# Patient Record
Sex: Male | Born: 1960 | Race: Black or African American | Hispanic: Refuse to answer | Marital: Single
Health system: Midwestern US, Community
[De-identification: ages and names within clinical notes are randomized; demographics above are authoritative.]

## PROBLEM LIST (undated history)

## (undated) DIAGNOSIS — I428 Other cardiomyopathies: Secondary | ICD-10-CM

## (undated) DIAGNOSIS — H6122 Impacted cerumen, left ear: Secondary | ICD-10-CM

## (undated) DIAGNOSIS — I4892 Unspecified atrial flutter: Secondary | ICD-10-CM

## (undated) DIAGNOSIS — I4891 Unspecified atrial fibrillation: Secondary | ICD-10-CM

## (undated) DIAGNOSIS — I5022 Chronic systolic (congestive) heart failure: Secondary | ICD-10-CM

---

## 2014-03-20 IMAGING — US DOP VENOUS LWR EXT RT
1 series · 15 of 17 positions shown · non-contrast
Comparison: None.

HISTORY: 52 year old Male, Pain in right leg; Acute embolism and thrombosis of deep veins of right lower extremity
TECHNIQUE: Right lower extremity venous duplex ultrasound exam performed.

[Series 1: dop venous lwr ext right · 15 of 17 slices shown]
[im 1/17]
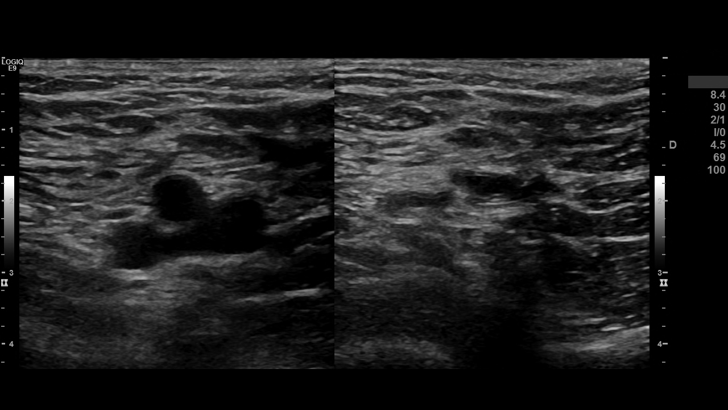
[im 2/17]
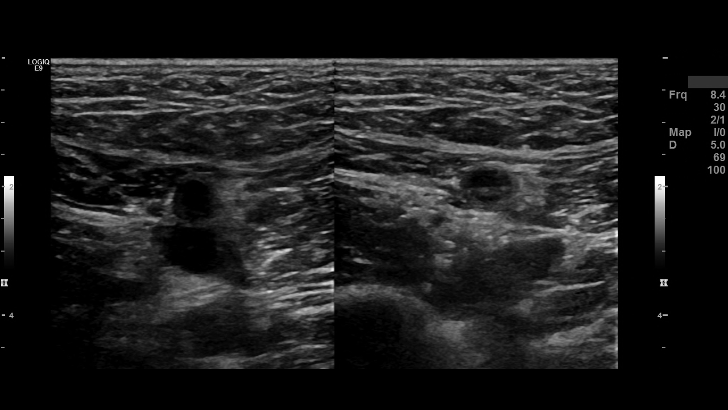
[im 3/17]
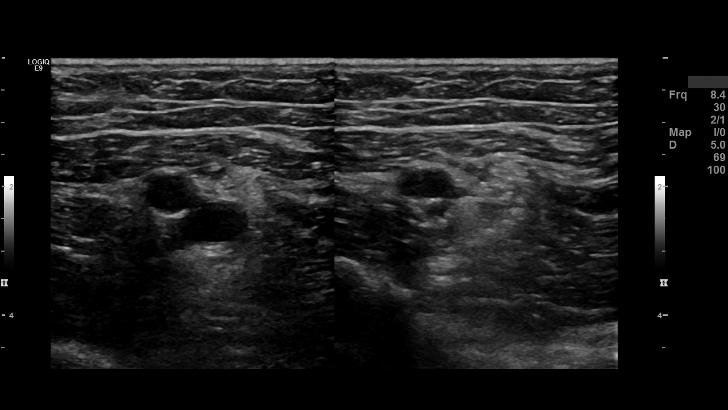
[im 4/17]
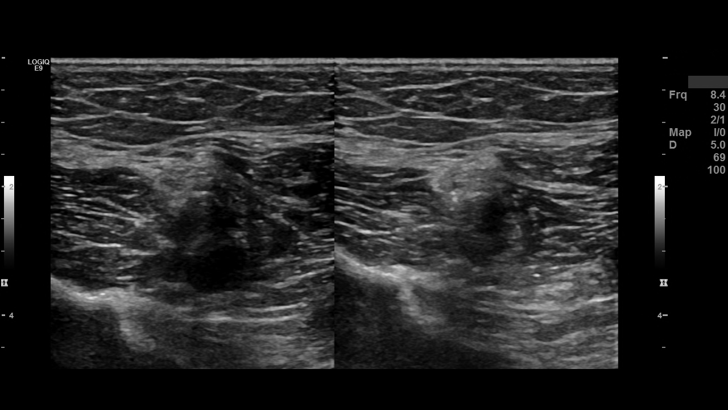
[im 6/17]
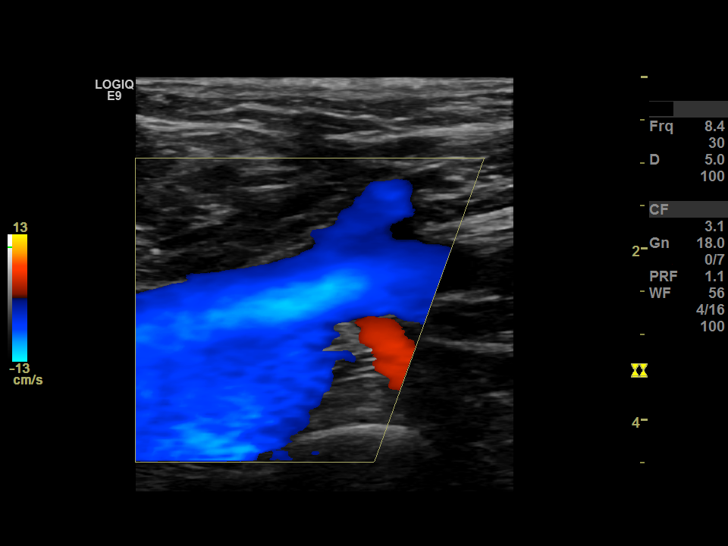
[im 7/17]
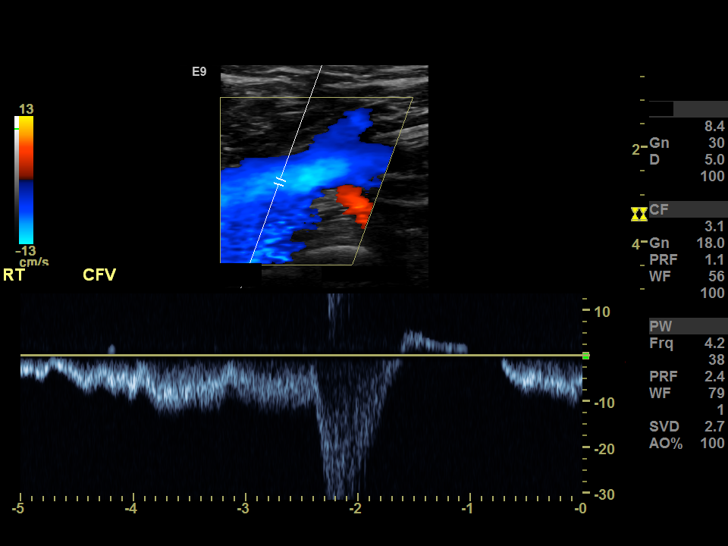
[im 8/17]
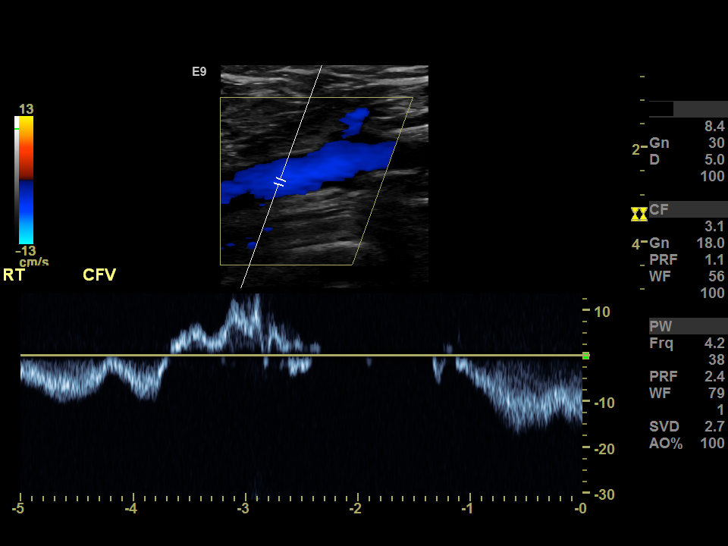
[im 9/17]
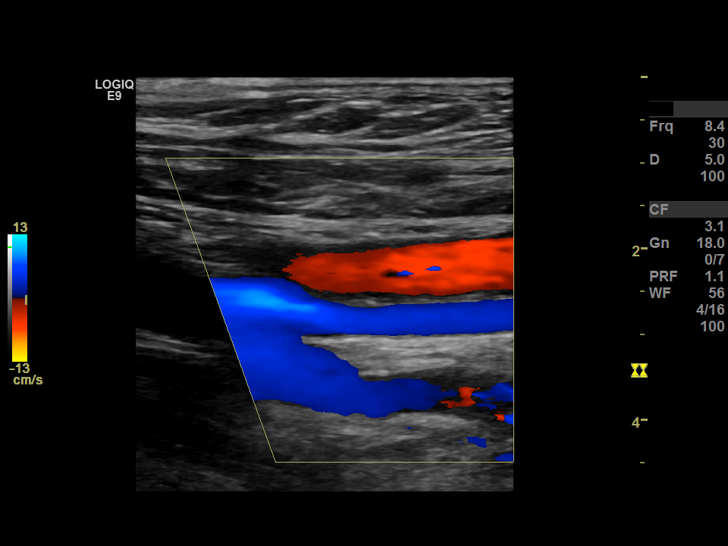
[im 10/17]
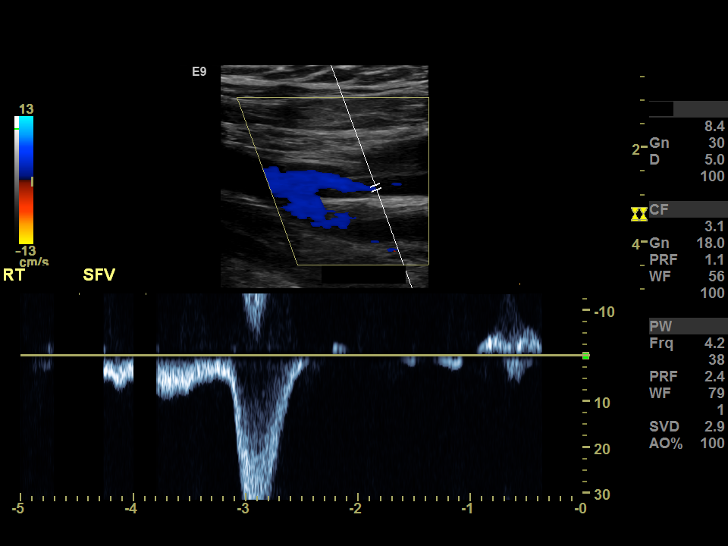
[im 11/17]
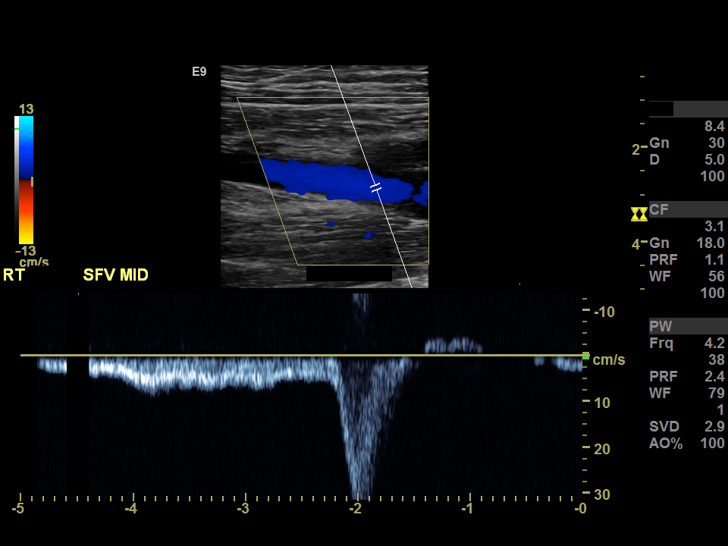
[im 12/17]
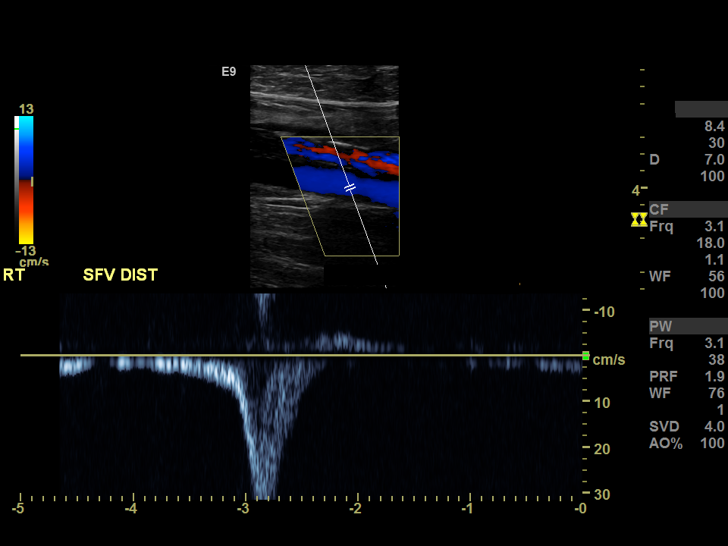
[im 14/17]
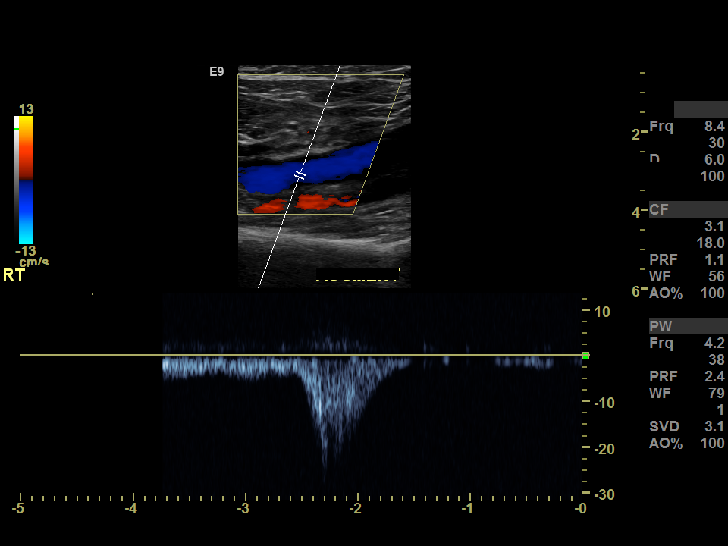
[im 15/17]
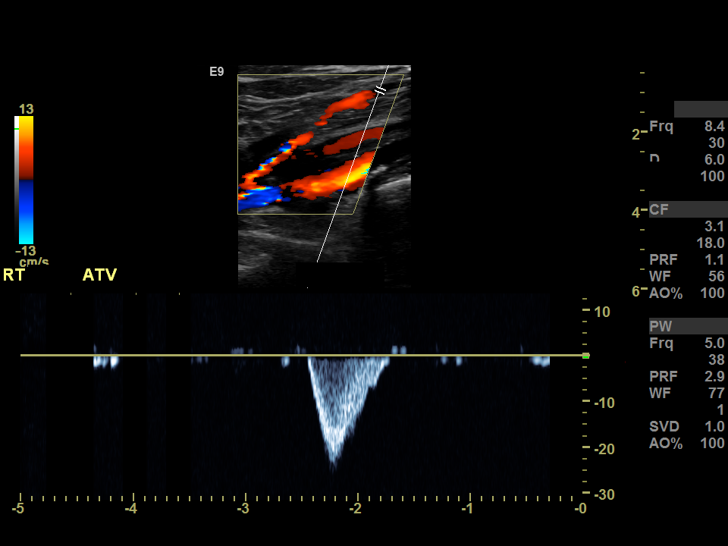
[im 16/17]
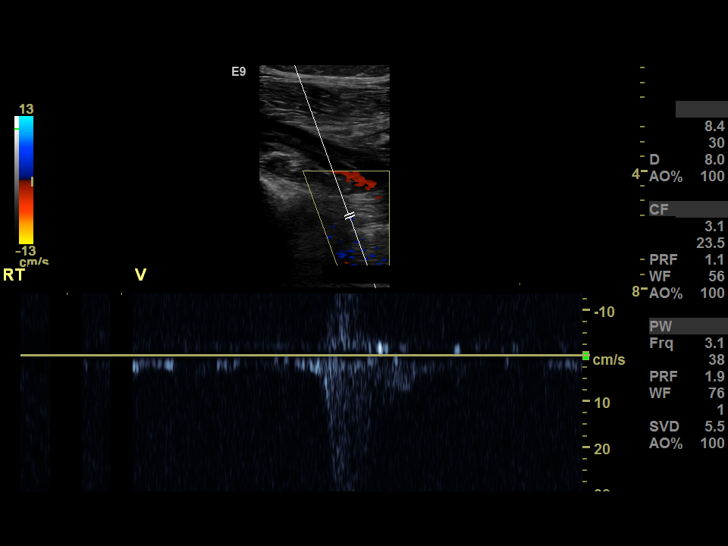
[im 17/17]
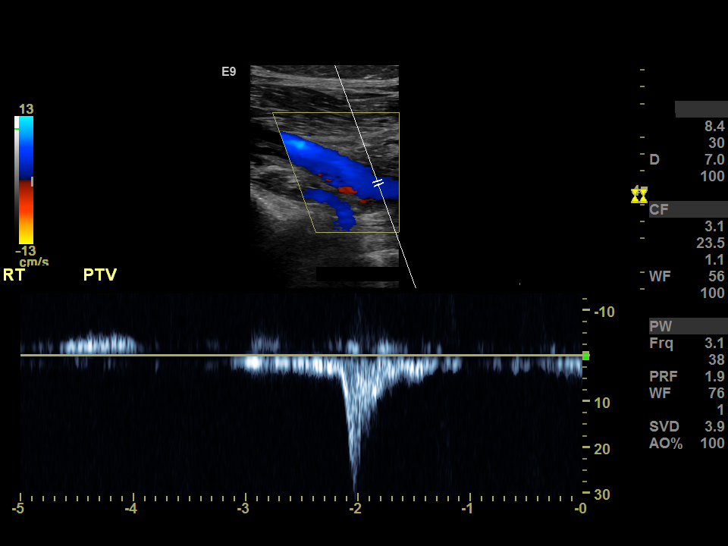

[15 of 17 positions shown; findings below may reference images not displayed]

FINDINGS: Deep veins of right lower extremity demonstrate normal blood flow, compressibility and augmentation without deep vein thrombosis.
IMPRESSION: There is no deep vein thrombosis of right lower extremity.

## 2014-03-25 IMAGING — US US RENAL W/DOPPLER RENAL
1 series · 14 of 25 positions shown · non-contrast
Comparison: None available.

HISTORY: Chronic kidney disease.
TECHNIQUE: High-resolution ultrasound with color-flow Doppler.

[Series 1: us renal w/doppler renal · 14 of 38 slices shown]
[im 1/38]
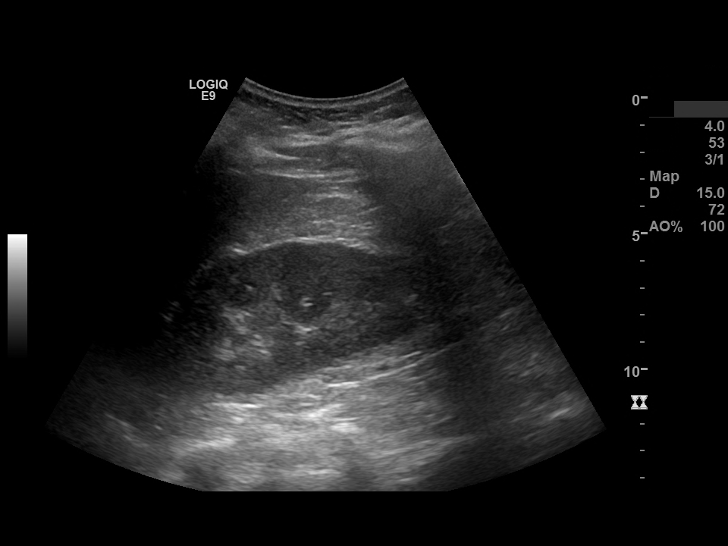
[im 4/38]
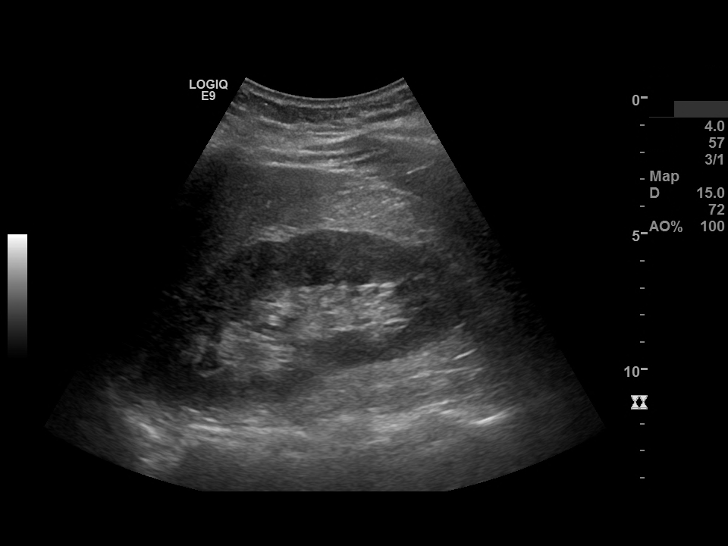
[im 7/38]
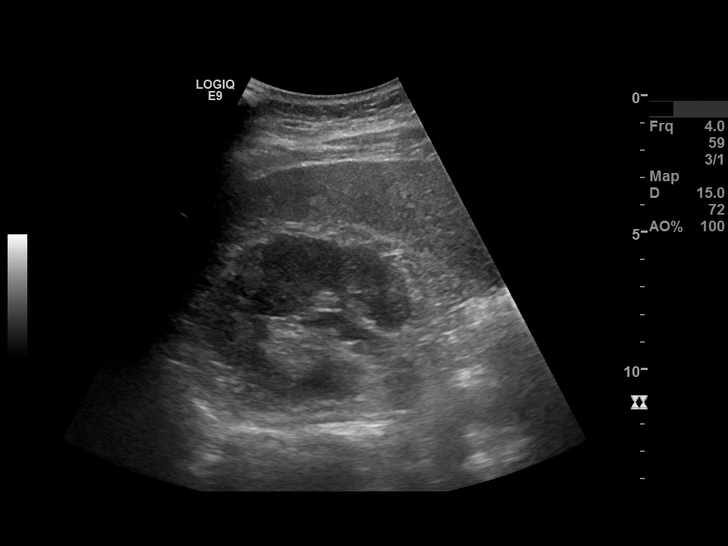
[im 10/38]
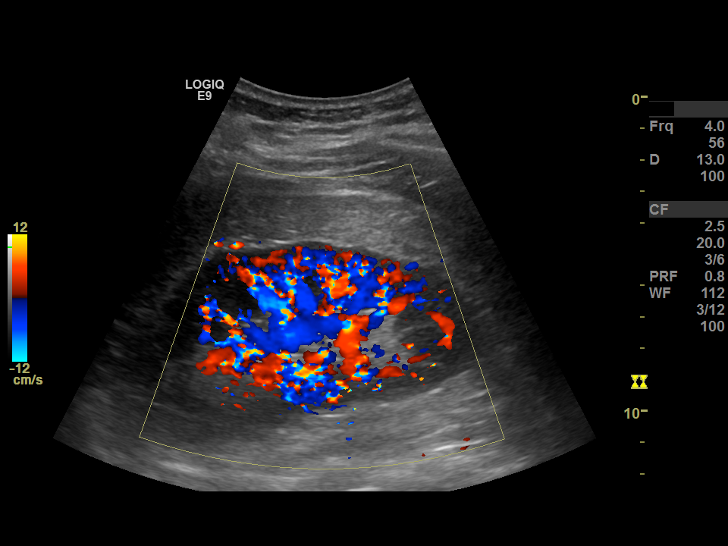
[im 13/38]
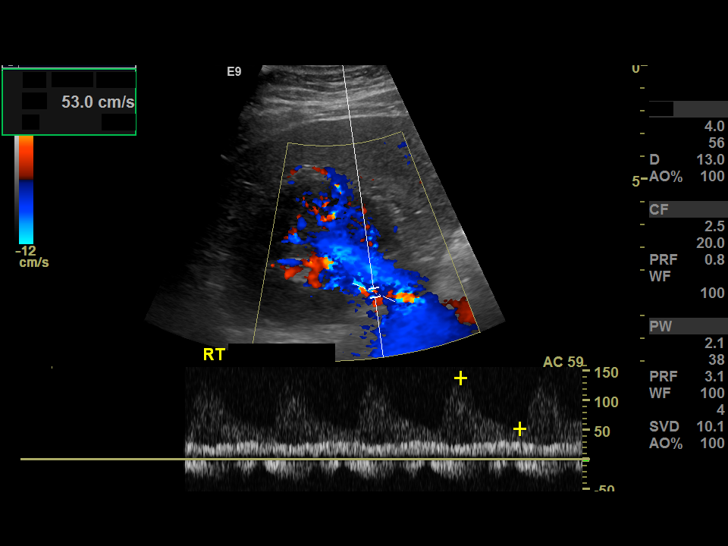
[im 14/38]
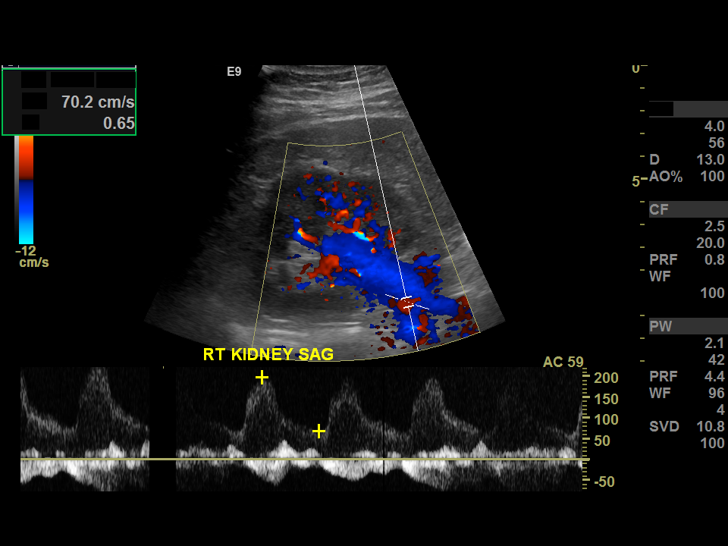
[im 17/38]
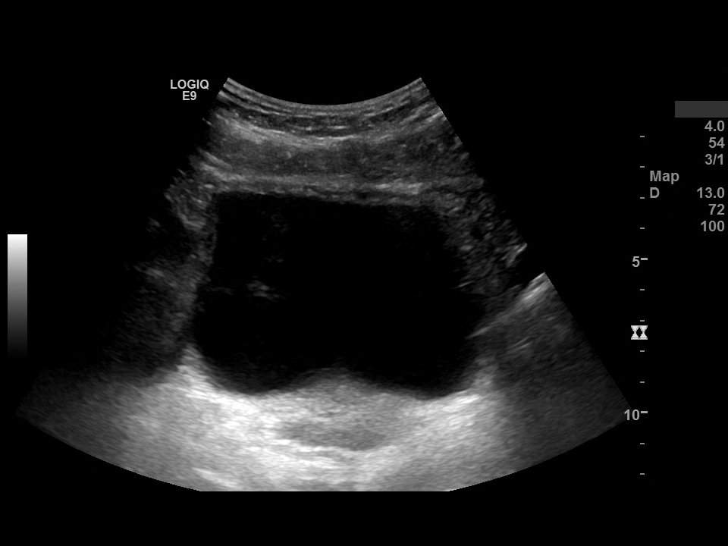
[im 21/38]
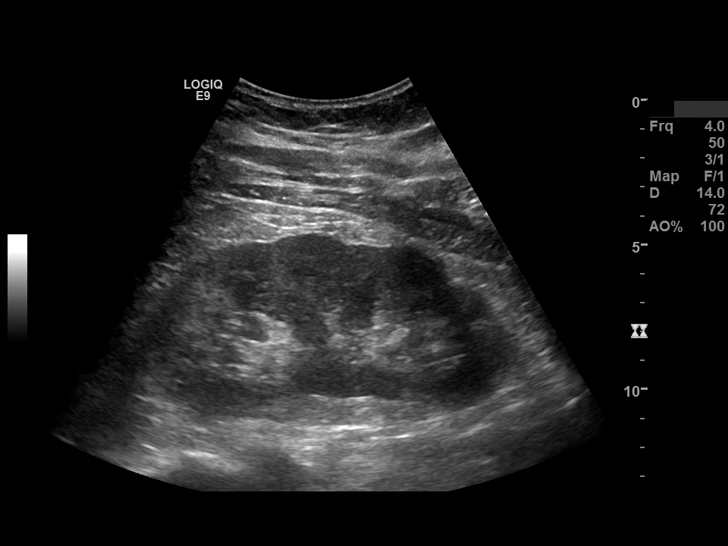
[im 24/38]
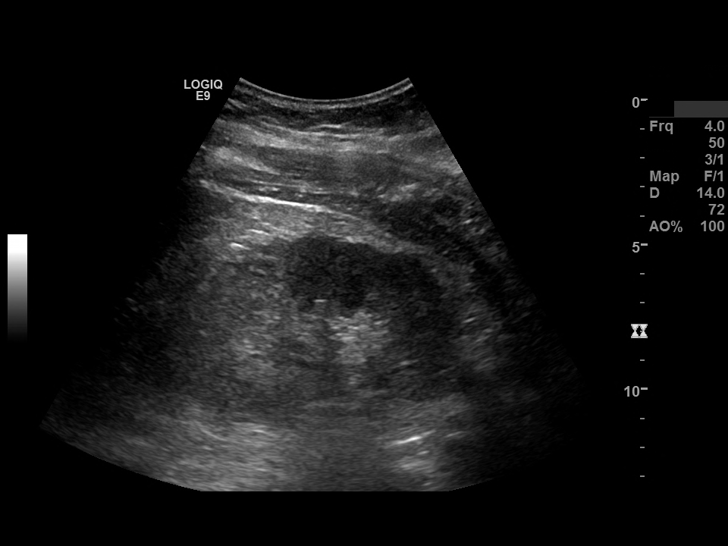
[im 25/38]
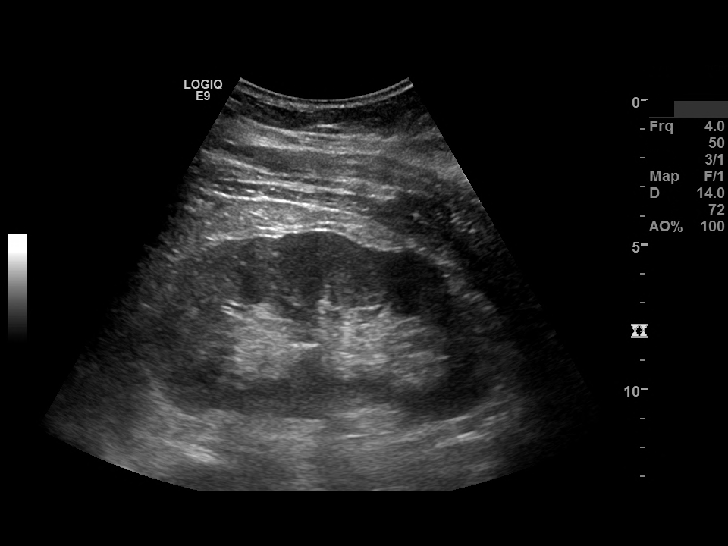
[im 28/38]
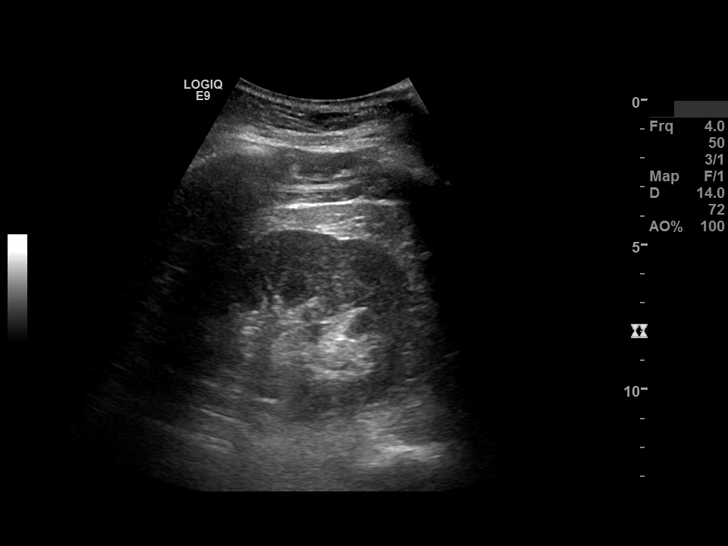
[im 31/38]
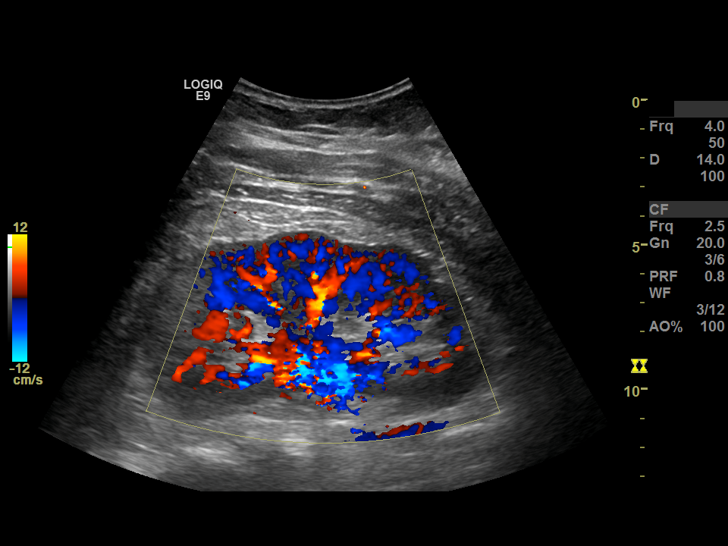
[im 34/38]
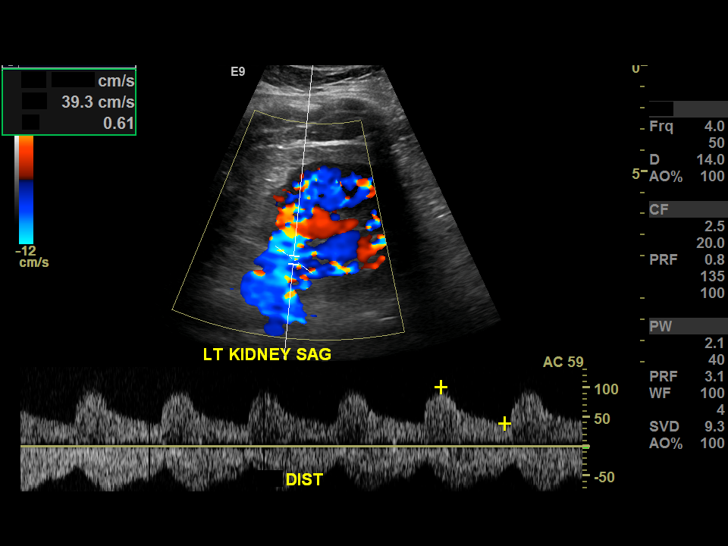
[im 38/38]
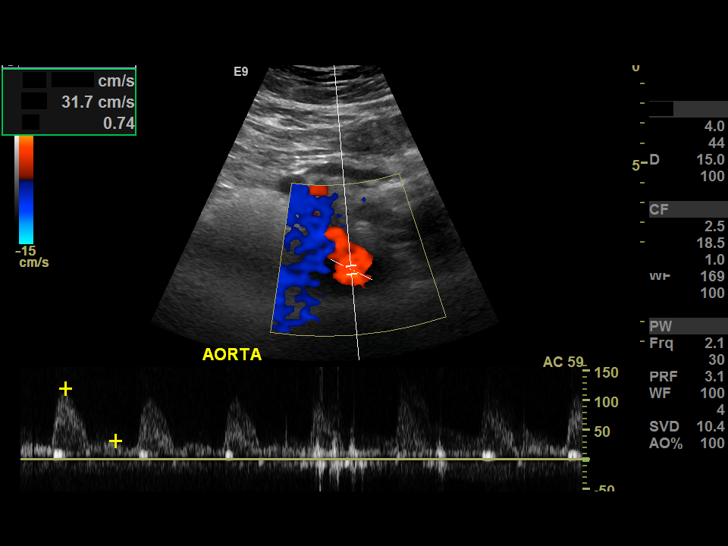

[14 of 25 positions shown; findings below may reference images not displayed]

Right kidney: 124 mm x 51 mm x 61 mm. Cortical thickness 14 mm. Resistive indices in the renal artery are normal. Doppler flow in the renal artery compared to the aorta shows no evidence of significant renal artery stenosis. There is normal flow visible in the renal vein.

Left kidney: 129 mm x 57 mm x 73 mm. Cortical thickness 21 mm. There are normal resistive indices in the branches of the left renal artery. Comparison of peak systolic velocities in the renal artery to the aorta show no evidence of hemodynamically significant stenosis. There is normal flow in the renal vein.

The urinary bladder is unremarkable.
IMPRESSION: 1. Both kidneys and the bladder are anatomically normal.

2. There is no evidence of renal vascular stenosis. Normal renal veins.

## 2017-08-06 ENCOUNTER — Emergency Department: Admit: 2017-08-07 | Payer: MEDICAID | Primary: Student in an Organized Health Care Education/Training Program

## 2017-08-06 DIAGNOSIS — I4891 Unspecified atrial fibrillation: Secondary | ICD-10-CM

## 2017-08-06 NOTE — Progress Notes (Signed)
[facility]: MED RECONCILIATION    Comments/Recommendations:   Med rec performed via interview with patient.  Patient denies any other RX or OTC medications.    Medications added:     Mucinex  Apap    Allergies: Patient has no known allergies.    Prior to Admission Medications:     Medication Documentation Review Audit       Reviewed by Prescott Parma, [facility] (Pharmacist) on 08/06/17 at 2152      Medication Sig Documenting Provider Last Dose Status Taking?   acetaminophen (TYLENOL) 325 mg tablet Take 325 mg by mouth every six (6) hours as needed for Pain. Provider, Historical  Active Yes   guaiFENesin ER (MUCINEX) 600 mg ER tablet Take 600 mg by mouth two (2) times daily as needed for Congestion. Provider, Historical  Active Yes                        Prescott Parma, [facility]   Contact: [phone]

## 2017-08-06 NOTE — H&P (Signed)
[facility]  [address] [address] [place], [place]  [address]  [phone]  Admission History and Physical      NAME:  BUCKLEY BRADLY   DOB:   08-28-1960   MRN:  [id number]     PCP:  None     Date/Time:  08/06/2017         Subjective:     CHIEF COMPLAINT: SOB     HISTORY OF PRESENT ILLNESS:     Mr. Edgell is a 57 y.o.  African American male who is admitted with afib with RVR.  Mr. Schnorr presented to the Emergency Department this PM complaining of SOB: for the past week, intermittent, worse with exertion, associated with cough    History obtained from chart review and the patient.     No previous records available for review    No past medical history on file. Patient denies any chronic medical problems    No past surgical history on file. Patient denies any prior surgeries    Social History     Tobacco Use   ??? Smoking status: Current Every Day Smoker     Packs/day: 0.50   Substance Use Topics   ??? Alcohol use: Yes        Family History   Problem Relation Age of Onset   ??? Heart Disease Other         No Known Allergies     Prior to Admission medications    Medication Sig Start Date End Date Taking? Authorizing Provider   guaiFENesin ER (MUCINEX) 600 mg ER tablet Take 600 mg by mouth two (2) times daily as needed for Congestion.   Yes Provider, Historical   acetaminophen (TYLENOL) 325 mg tablet Take 325 mg by mouth every six (6) hours as needed for Pain.   Yes Provider, Historical         Review of Systems:  (bold if positive, if negative)    Gen:  Eyes:  ENT:  CVS:  chest painPulm:  Cough, dyspnea, sputumGI:    GU:    MS:  Skin:  Psych:  Endo:    Hem:  Renal:    Neuro:            Objective:      VITALS:    Vital signs reviewed; most recent are:    Visit Vitals  BP 111/66   Pulse (!) 114   Resp 21   Ht  (1.727 m)   Wt 74.8 kg (165 lb)   SpO2 96%   BMI 25.09 kg/m??     SpO2 Readings from Last 6 Encounters:   08/06/17 96%         No intake or output data in the 24 hours ending 08/06/17 2221         Exam:     Physical Exam:    Gen: Well-developed, well-nourished, in no acute distress  HEENT:  Pink conjunctivae, PERRL, hearing intact to voice, moist mucous membranes  Neck: Supple, without masses, thyroid non-tender  Resp: No accessory muscle use, clear breath sounds without wheezes rales or rhonchi  Card: No murmurs, normal S1, S2 without thrills, bruits or peripheral edema  Abd:  Soft, non-tender, non-distended, normoactive bowel sounds are present, no palpable organomegaly and no detectable hernias  Lymph:  No cervical or inguinal adenopathy  Musc: No cyanosis or clubbing  Skin: No rashes or ulcers, skin turgor is good  Neuro:  Cranial nerves are grossly intact,  no focal motor weakness, follows commands appropriately  Psych:  Good insight, oriented to person, place and time, alert             Labs:    Recent Labs     08/06/17  2020   WBC 6.2   HGB 14.6   HCT 43.3   PLT 181     Recent Labs     08/06/17  2020   NA 142   K 4.2   CL 114*   CO2 21   GLU 102*   BUN 16   CREA 1.06   CA 8.8   MG 1.7   ALB 3.3*   TBILI 0.7   SGOT 52*   ALT 116*     No results found for: GLUCPOC  No results for input(s): PH, PCO2, PO2, HCO3, FIO2 in the last 72 hours.  No results for input(s): INR in the last 72 hours.    No lab exists for component: INREXT, INREXT    Additional testing:  Chest X-ray: mild interstitial infiltrates, possible CHF, visualized by me.  Results not reviewed with Radiologist.       Assessment/Plan:       Principal Problem:    Atrial fibrillation with RVR (HCC) (08/06/2017)   - unable to rate control with diltiazem due to hypotension    - monitor in ICU closely   - consider loading with digoxin, pending Cardiology evaluation   - check TSH and echocardiogram    Active Problems:    Chest pain (08/06/2017)/Elevated troponin (08/06/2017)   - possible demand ischemia related to increased rated   - trend enzymes, echo as above       Hyperglycemia (08/06/2017)   - check A1c       Code status:    - patient is FULL CODE, no AMD on file, no NOK listed      Total time spent on patient care: 74 Minutes                  Care Plan discussed with: Patient, Nursing Staff and Dr. Delane Ginger    Discussed:  Code Status, Care Plan and D/C Planning    Prophylaxis:  Lovenox and SCD's    Probable Disposition:  Home w/Family           ___________________________________________________    Attending Physician: Marijo File, MD

## 2017-08-06 NOTE — ED Triage Notes (Signed)
"  I've been really short of breath and I've got a lot of mucus, sometimes white, sometimes yellow. I'm wheezing and coughing."

## 2017-08-06 NOTE — ED Provider Notes (Signed)
Pt presents ambulatory to ED with c/o of SOB which began around 07/30/17 (0ne week ago). PT also reports chest pressure, productive cough (w/ white phlegm), and wheezing. Pt denies fever or any other current pain.  Sx's moderate.  No relieving factors.    No significant PMH.  + smoker.      Note written by Alonza Bogus, Scribe, as dictated by Catalina Pizza, MD 8:04 PM      The history is provided by the patient. No language interpreter was used.        No past medical history on file.    No past surgical history on file.      No family history on file.    Social History     Socioeconomic History   ??? Marital status: Not on file     Spouse name: Not on file   ??? Number of children: Not on file   ??? Years of education: Not on file   ??? Highest education level: Not on file   Occupational History   ??? Not on file   Social Needs   ??? Financial resource strain: Not on file   ??? Food insecurity:     Worry: Not on file     Inability: Not on file   ??? Transportation needs:     Medical: Not on file     Non-medical: Not on file   Tobacco Use   ??? Smoking status: Not on file   Substance and Sexual Activity   ??? Alcohol use: Not on file   ??? Drug use: Not on file   ??? Sexual activity: Not on file   Lifestyle   ??? Physical activity:     Days per week: Not on file     Minutes per session: Not on file   ??? Stress: Not on file   Relationships   ??? Social connections:     Talks on phone: Not on file     Gets together: Not on file     Attends religious service: Not on file     Active member of club or organization: Not on file     Attends meetings of clubs or organizations: Not on file     Relationship status: Not on file   ??? Intimate partner violence:     Fear of current or ex partner: Not on file     Emotionally abused: Not on file     Physically abused: Not on file     Forced sexual activity: Not on file   Other Topics Concern   ??? Not on file   Social History Narrative   ??? Not on file          ALLERGIES: Patient has no allergy information on record.    Review of Systems   Constitutional: Negative for fever.   Respiratory: Positive for cough, shortness of breath and wheezing.    Cardiovascular: Positive for chest pain.   Gastrointestinal: Negative for abdominal pain.   Musculoskeletal: Negative for back pain and neck pain.   All other systems reviewed and are negative.      There were no vitals filed for this visit.         Physical Exam   Constitutional: He appears well-developed and well-nourished.   Mildly ill-appearing   HENT:   Head: Normocephalic and atraumatic.   Eyes: Conjunctivae are normal.   Neck: Neck supple. No tracheal deviation present.   Cardiovascular: Regular rhythm, normal heart sounds  and intact distal pulses. Exam reveals no gallop and no friction rub.   No murmur heard.  tachycardic   Pulmonary/Chest: Effort normal.   Scattered exp wheezing; left-sided rhonchi   Abdominal: Soft. There is no tenderness.   Musculoskeletal: He exhibits no edema or deformity.   Neurological: He is alert.   oriented   Skin: Skin is warm and dry.   Psychiatric: He has a normal mood and affect.   Nursing note and vitals reviewed.       MDM       Procedures    EKG: A-flutter; rate - 145; NSSTTW abnl.  Catalina Pizza, MD  Total critical care time spent exclusive of procedures:  35 min.  Catalina Pizza, MD    CONSULT NOTE:  9:30 PM Catalina Pizza, MD spoke with Dr. Verlin Fester, Consult for Hospitalist.  Discussed available diagnostic tests and clinical findings.  Dr. Verlin Fester will admit and see the pt.    9:30 PM  Patient is being admitted to the hospital.      CONSULT NOTE:  9:41 PM Catalina Pizza, MD spoke with Dr. Lorella Nimrod, Consult for Cardiology.  Discussed available diagnostic tests and clinical findings.  Dr. Lorella Nimrod recommends administering 0.25 mg of digoxin right now and another 0.25 mg in one hour. Dr. Lorella Nimrod also recommends to hold off on any diuretic at this time         A/P: new-onset A-flutter with RVR - got Cardizem bolus; drip started but had to stop due to low BP: Digoxin ordered per cards recommendations; + elevated pro-BNP; admit for further management.  Catalina Pizza, MD

## 2017-08-07 ENCOUNTER — Inpatient Hospital Stay: Admit: 2017-08-07 | Payer: MEDICAID | Primary: Student in an Organized Health Care Education/Training Program

## 2017-08-07 ENCOUNTER — Inpatient Hospital Stay
Admit: 2017-08-07 | Discharge: 2017-08-10 | Disposition: A | Discharge status: Home / Self Care | Payer: MEDICAID | Attending: Internal Medicine | Admitting: Internal Medicine

## 2017-08-07 LAB — TRANSTHORACIC ECHOCARDIOGRAM (TTE) COMPLETE (CONTRAST/BUBBLE/3D PRN)
Aortic Root: 3.21 cm
Ascending Aorta: 3.3 cm
EF BP: 19.4 % — AB (ref 55–100)
Est. RA Pressure: 8 mmHg
IVC Sniffing: 3.08 cm
IVSd: 0.76 cm (ref 0.6–1)
LA Area 4C: 25.7 cm2
LA Major Axis: 4.56 cm
LA Volume 2C: 70.62 mL — AB (ref 18–58)
LA Volume 4C: 76.64 mL — AB (ref 18–58)
LA Volume BP: 77.9 mL — AB (ref 18–58)
LA Volume Index 2C: 37.5 ml/m2 (ref 16–28)
LA Volume Index 4C: 40.69 ml/m2 (ref 16–28)
LA Volume Index BP: 41.36 ml/m2 (ref 16–28)
LA/AO Root Ratio: 1.42
LV E' Lateral Velocity: 13.06 centimeter/second
LV E' Septal Velocity: 7.84 centimeter/second
LV EDV A2C: 238.5 mL
LV EDV A4C: 196.5 mL
LV EDV BP: 217.1 ml — AB (ref 67–155)
LV EDV Index A2C: 126.6 mL/m2
LV EDV Index A4C: 104.3 mL/m2
LV EDV Index BP: 115.3 mL/m2
LV ESV A2C: 153.8 mL
LV ESV A4C: 187.6 mL
LV ESV BP: 175 mL — AB (ref 22–58)
LV ESV Index A2C: 81.7 mL/m2
LV ESV Index A4C: 99.6 mL/m2
LV ESV Index BP: 92.9 mL/m2
LV Ejection Fraction A2C: 36 %
LV Ejection Fraction A4C: 5 %
LV Mass 2D Index: 147.8 g/m2 — AB (ref 49–115)
LV Mass 2D: 278.4 g — AB (ref 88–224)
LVIDd: 7.04 cm — AB (ref 4.2–5.9)
LVIDs: 6.12 cm
LVOT Diameter: 2.01 cm
LVOT Peak Gradient: 2.8 mmHg
LVOT Peak Velocity: 84.32 cm/s
LVPWd: 0.77 cm (ref 0.6–1)
Left Ventricular Ejection Fraction: 23
MR Peak Gradient: 92.9 mmHg
MR Peak Velocity: 481.91 cm/s
MR Radius PISA: 0.36 cm
PASP: 42.8 mmHg
PV Max Velocity: 90.82 cm/s
PV Peak Gradient: 3.3 mmHg
RVIDd: 4.69 cm
RVSP: 42.8 mmHg
TAPSE: 2.02 cm — AB (ref 1.5–2)
TR Max Velocity: 294.95 cm/s
TR Peak Gradient: 34.8 mmHg

## 2017-08-07 LAB — EKG 12-LEAD
Atrial Rate: 290 {beats}/min
P Axis: 79 degrees
Q-T Interval: 252 ms
QRS Duration: 80 ms
QTc Calculation (Bazett): 391 ms
R Axis: -14 degrees
T Axis: 119 degrees
Ventricular Rate: 145 {beats}/min

## 2017-08-07 LAB — TRANSESOPHAGEAL ECHOCARDIOGRAM (CONTRAST/3D PRN): Left Ventricular Ejection Fraction: 18

## 2017-08-07 LAB — CBC WITH AUTOMATED DIFF
ABS. BASOPHILS: 0 10*3/uL (ref 0.0–0.1)
ABS. EOSINOPHILS: 0 10*3/uL (ref 0.0–0.4)
ABS. IMM. GRANS.: 0 10*3/uL (ref 0.00–0.04)
ABS. LYMPHOCYTES: 2.2 10*3/uL (ref 0.8–3.5)
ABS. MONOCYTES: 0.7 10*3/uL (ref 0.0–1.0)
ABS. NEUTROPHILS: 3.2 10*3/uL (ref 1.8–8.0)
ABSOLUTE NRBC: 0 10*3/uL (ref 0.00–0.01)
BASOPHILS: 1 % (ref 0–1)
EOSINOPHILS: 1 % (ref 0–7)
HCT: 43.3 % (ref 36.6–50.3)
HGB: 14.6 g/dL (ref 12.1–17.0)
IMMATURE GRANULOCYTES: 0 % (ref 0.0–0.5)
LYMPHOCYTES: 36 % (ref 12–49)
MCH: 33.1 PG (ref 26.0–34.0)
MCHC: 33.7 g/dL (ref 30.0–36.5)
MCV: 98.2 FL (ref 80.0–99.0)
MONOCYTES: 11 % (ref 5–13)
MPV: 10.5 FL (ref 8.9–12.9)
NEUTROPHILS: 51 % (ref 32–75)
NRBC: 0 PER 100 WBC
PLATELET: 181 10*3/uL (ref 150–400)
RBC: 4.41 M/uL (ref 4.10–5.70)
RDW: 14.1 % (ref 11.5–14.5)
WBC: 6.2 10*3/uL (ref 4.1–11.1)

## 2017-08-07 LAB — ECHO ADULT COMPLETE
Aortic Root: 3.21 cm
Ascending Aorta: 3.3 cm
EF BP: 19.4 % — AB (ref 55–100)
Est. RA Pressure: 8 mmHg
IVC Sniffing: 3.08 cm
IVSd: 0.76 cm (ref 0.6–1.0)
LA Area 4C: 25.7 cm2
LA Major Axis: 4.56 cm
LA Volume 2C: 70.62 mL — AB (ref 18–58)
LA Volume 4C: 76.64 mL — AB (ref 18–58)
LA Volume BP: 77.9 mL — AB (ref 18–58)
LA Volume Index 2C: 37.5 ml/m2 (ref 16–28)
LA Volume Index 4C: 40.69 ml/m2 (ref 16–28)
LA Volume Index BP: 41.36 ml/m2 (ref 16–28)
LA/AO Root Ratio: 1.42
LV E' Lateral Velocity: 13.06 centimeter/second
LV E' Septal Velocity: 7.84 centimeter/second
LV EDV A2C: 238.5 mL
LV EDV A4C: 196.5 mL
LV EDV BP: 217.1 ml — AB (ref 67–155)
LV EDV Index A2C: 126.6 mL/m2
LV EDV Index A4C: 104.3 mL/m2
LV EDV Index BP: 115.3 mL/m2
LV ESV A2C: 153.8 mL
LV ESV A4C: 187.6 mL
LV ESV BP: 175 mL — AB (ref 22–58)
LV ESV Index A2C: 81.7 mL/m2
LV ESV Index A4C: 99.6 mL/m2
LV ESV Index BP: 92.9 mL/m2
LV Ejection Fraction A2C: 36 %
LV Ejection Fraction A4C: 5 %
LV Mass 2D Index: 147.8 g/m2 — AB (ref 49–115)
LV Mass 2D: 278.4 g — AB (ref 88–224)
LVIDd: 7.04 cm — AB (ref 4.2–5.9)
LVIDs: 6.12 cm
LVOT Diameter: 2.01 cm
LVOT Peak Gradient: 2.8 mmHg
LVOT Peak Velocity: 84.32 cm/s
LVPWd: 0.77 cm (ref 0.6–1.0)
MR Peak Gradient: 92.9 mmHg
MR Peak Velocity: 481.91 cm/s
MR Radius PISA: 0.36 cm
PASP: 42.8 mmHg
PV Max Velocity: 90.82 cm/s
PV Peak Gradient: 3.3 mmHg
RVIDd: 4.69 cm
RVSP: 42.8 mmHg
TAPSE: 2.02 cm — AB (ref 1.5–2.0)
TR Max Velocity: 294.95 cm/s
TR Peak Gradient: 34.8 mmHg

## 2017-08-07 LAB — CBC W/O DIFF
ABSOLUTE NRBC: 0 10*3/uL (ref 0.00–0.01)
HCT: 40 % (ref 36.6–50.3)
HGB: 13.7 g/dL (ref 12.1–17.0)
MCH: 34 PG (ref 26.0–34.0)
MCHC: 34.3 g/dL (ref 30.0–36.5)
MCV: 99.3 FL — ABNORMAL HIGH (ref 80.0–99.0)
MPV: 10.6 FL (ref 8.9–12.9)
NRBC: 0 PER 100 WBC
PLATELET: 175 10*3/uL (ref 150–400)
RBC: 4.03 M/uL — ABNORMAL LOW (ref 4.10–5.70)
RDW: 14.4 % (ref 11.5–14.5)
WBC: 6.3 10*3/uL (ref 4.1–11.1)

## 2017-08-07 LAB — METABOLIC PANEL, COMPREHENSIVE
A-G Ratio: 0.9 — ABNORMAL LOW (ref 1.1–2.2)
ALT (SGPT): 116 U/L — ABNORMAL HIGH (ref 12–78)
AST (SGOT): 52 U/L — ABNORMAL HIGH (ref 15–37)
Albumin: 3.3 g/dL — ABNORMAL LOW (ref 3.5–5.0)
Alk. phosphatase: 143 U/L — ABNORMAL HIGH (ref 45–117)
Anion gap: 7 mmol/L (ref 5–15)
BUN/Creatinine ratio: 15 (ref 12–20)
BUN: 16 MG/DL (ref 6–20)
Bilirubin, total: 0.7 MG/DL (ref 0.2–1.0)
CO2: 21 mmol/L (ref 21–32)
Calcium: 8.8 MG/DL (ref 8.5–10.1)
Chloride: 114 mmol/L — ABNORMAL HIGH (ref 97–108)
Creatinine: 1.06 MG/DL (ref 0.70–1.30)
GFR est AA: >60 mL/min/{1.73_m2} (ref >60)
GFR est non-AA: >60 mL/min/{1.73_m2} (ref >60)
Globulin: 3.6 g/dL (ref 2.0–4.0)
Glucose: 102 mg/dL — ABNORMAL HIGH (ref 65–100)
Potassium: 4.2 mmol/L (ref 3.5–5.1)
Protein, total: 6.9 g/dL (ref 6.4–8.2)
Sodium: 142 mmol/L (ref 136–145)

## 2017-08-07 LAB — MAGNESIUM
Magnesium: 1.7 mg/dL (ref 1.6–2.4)
Magnesium: 1.7 mg/dL (ref 1.6–2.4)

## 2017-08-07 LAB — TROPONIN I
Troponin-I, Qt.: 0.08 ng/mL — ABNORMAL HIGH (ref <0.05)
Troponin-I, Qt.: 0.09 ng/mL — ABNORMAL HIGH (ref <0.05)
Troponin-I, Qt.: 0.08 ng/mL — ABNORMAL HIGH (ref <0.05)
Troponin-I, Qt.: 0.09 ng/mL — ABNORMAL HIGH (ref <0.05)

## 2017-08-07 LAB — SAMPLES BEING HELD

## 2017-08-07 LAB — METABOLIC PANEL, BASIC
Anion gap: 7 mmol/L (ref 5–15)
BUN/Creatinine ratio: 18 (ref 12–20)
BUN: 16 MG/DL (ref 6–20)
CO2: 20 mmol/L — ABNORMAL LOW (ref 21–32)
Calcium: 8.6 MG/DL (ref 8.5–10.1)
Chloride: 113 mmol/L — ABNORMAL HIGH (ref 97–108)
Creatinine: 0.91 MG/DL (ref 0.70–1.30)
GFR est AA: >60 mL/min/{1.73_m2} (ref >60)
GFR est non-AA: >60 mL/min/{1.73_m2} (ref >60)
Glucose: 105 mg/dL — ABNORMAL HIGH (ref 65–100)
Potassium: 4.2 mmol/L (ref 3.5–5.1)
Sodium: 140 mmol/L (ref 136–145)

## 2017-08-07 LAB — EKG, 12 LEAD, INITIAL
Atrial Rate: 290 {beats}/min
Calculated P Axis: 79 degrees
Calculated R Axis: -14 degrees
Calculated T Axis: 119 degrees
Q-T Interval: 252 ms
QRS Duration: 80 ms
QTC Calculation (Bezet): 391 ms
Ventricular Rate: 145 {beats}/min

## 2017-08-07 LAB — PHOSPHORUS: Phosphorus: 3.4 MG/DL (ref 2.6–4.7)

## 2017-08-07 LAB — HEMOGLOBIN A1C WITH EAG
Est. average glucose: 123 mg/dL
Hemoglobin A1c: 5.9 % (ref 4.2–6.3)

## 2017-08-07 LAB — TSH 3RD GENERATION: TSH: 1.22 u[IU]/mL (ref 0.36–3.74)

## 2017-08-07 LAB — NT-PRO BNP: NT pro-BNP: 8904 PG/ML — ABNORMAL HIGH (ref <125)

## 2017-08-07 MED ORDER — ENOXAPARIN 80 MG/0.8 ML SUB-Q SYRINGE
80 mg/0.8 mL | Freq: Two times a day (BID) | SUBCUTANEOUS | Status: DC
Start: 2017-08-07 — End: 2017-08-07
  Administered 2017-08-07 (×2): via SUBCUTANEOUS

## 2017-08-07 MED ORDER — OXYCODONE-ACETAMINOPHEN 5 MG-325 MG TAB
5-325 mg | ORAL | Status: DC | PRN
Start: 2017-08-07 — End: 2017-08-10

## 2017-08-07 MED ORDER — SODIUM CHLORIDE 0.9% BOLUS IV
0.9 % | Freq: Once | INTRAVENOUS | Status: AC
Start: 2017-08-07 — End: 2017-08-08
  Administered 2017-08-07: 18:00:00 via INTRAVENOUS

## 2017-08-07 MED ORDER — DILTIAZEM HCL 5 MG/ML IV SOLN
5 mg/mL | INTRAVENOUS | Status: DC
Start: 2017-08-07 — End: 2017-08-07
  Administered 2017-08-07 (×2): via INTRAVENOUS

## 2017-08-07 MED ORDER — LEVALBUTEROL 1.25 MG/3 ML SOLN FOR INHALATION
1.25 mg/3 mL | RESPIRATORY_TRACT | Status: DC
Start: 2017-08-07 — End: 2017-08-10
  Administered 2017-08-07 – 2017-08-10 (×16): via RESPIRATORY_TRACT

## 2017-08-07 MED ORDER — ALBUTEROL SULFATE 0.083 % (0.83 MG/ML) SOLN FOR INHALATION
2.5 mg /3 mL (0.083 %) | RESPIRATORY_TRACT | Status: AC
Start: 2017-08-07 — End: 2017-08-07
  Administered 2017-08-07: 07:00:00 via RESPIRATORY_TRACT

## 2017-08-07 MED ORDER — IPRATROPIUM BROMIDE 0.02 % SOLN FOR INHALATION
0.02 % | RESPIRATORY_TRACT | Status: DC
Start: 2017-08-07 — End: 2017-08-10
  Administered 2017-08-07 – 2017-08-10 (×16): via RESPIRATORY_TRACT

## 2017-08-07 MED ORDER — BUDESONIDE 0.5 MG/2 ML NEB SUSPENSION
0.5 mg/2 mL | Freq: Two times a day (BID) | RESPIRATORY_TRACT | Status: DC
Start: 2017-08-07 — End: 2017-08-08
  Administered 2017-08-08 (×2): via RESPIRATORY_TRACT

## 2017-08-07 MED ORDER — SODIUM CHLORIDE 0.9 % IV
INTRAVENOUS | Status: DC
Start: 2017-08-07 — End: 2017-08-07
  Administered 2017-08-07: 02:00:00 via INTRAVENOUS

## 2017-08-07 MED ORDER — SODIUM CHLORIDE 0.9 % IJ SYRG
INTRAMUSCULAR | Status: DC | PRN
Start: 2017-08-07 — End: 2017-08-10

## 2017-08-07 MED ORDER — DIGOXIN 250 MCG/ML IJ SOLN
250 mcg/mL (0.25 mg/mL) | INTRAMUSCULAR | Status: AC
Start: 2017-08-07 — End: 2017-08-07
  Administered 2017-08-07: 07:00:00 via INTRAVENOUS

## 2017-08-07 MED ORDER — FENTANYL CITRATE (PF) 50 MCG/ML IJ SOLN
50 mcg/mL | INTRAMUSCULAR | Status: DC | PRN
Start: 2017-08-07 — End: 2017-08-07
  Administered 2017-08-07: 18:00:00 via INTRAVENOUS

## 2017-08-07 MED ORDER — AMIODARONE 50 MG/ML IV SOLN
50 mg/mL | Freq: Once | INTRAVENOUS | Status: AC
Start: 2017-08-07 — End: 2017-08-07
  Administered 2017-08-07: 23:00:00 via INTRAVENOUS

## 2017-08-07 MED ORDER — PROCHLORPERAZINE EDISYLATE 5 MG/ML INJECTION
5 mg/mL | Freq: Four times a day (QID) | INTRAMUSCULAR | Status: DC | PRN
Start: 2017-08-07 — End: 2017-08-10

## 2017-08-07 MED ORDER — DILTIAZEM HCL 5 MG/ML IV SOLN
5 mg/mL | INTRAVENOUS | Status: AC
Start: 2017-08-07 — End: 2017-08-06
  Administered 2017-08-07: 01:00:00 via INTRAVENOUS

## 2017-08-07 MED ORDER — METHYLPREDNISOLONE (PF) 125 MG/2 ML IJ SOLR
125 mg/2 mL | Freq: Two times a day (BID) | INTRAMUSCULAR | Status: DC
Start: 2017-08-07 — End: 2017-08-08
  Administered 2017-08-07 – 2017-08-08 (×3): via INTRAVENOUS

## 2017-08-07 MED ORDER — ACETAMINOPHEN 325 MG TABLET
325 mg | ORAL | Status: DC | PRN
Start: 2017-08-07 — End: 2017-08-10
  Administered 2017-08-07: 07:00:00 via ORAL

## 2017-08-07 MED ORDER — SODIUM CHLORIDE 0.9 % IJ SYRG
Freq: Three times a day (TID) | INTRAMUSCULAR | Status: DC
Start: 2017-08-07 — End: 2017-08-10
  Administered 2017-08-07 – 2017-08-10 (×9): via INTRAVENOUS

## 2017-08-07 MED ORDER — MAGNESIUM SULFATE 2 GRAM/50 ML IVPB
2 gram/50 mL (4 %) | Freq: Once | INTRAVENOUS | Status: AC
Start: 2017-08-07 — End: 2017-08-07
  Administered 2017-08-07: 13:00:00 via INTRAVENOUS

## 2017-08-07 MED ORDER — LIDOCAINE 2 % MUCOSAL SOLN
2 % | Status: DC | PRN
Start: 2017-08-07 — End: 2017-08-07
  Administered 2017-08-07: 18:00:00 via OROMUCOSAL

## 2017-08-07 MED ORDER — SODIUM CHLORIDE 0.9 % IV
INTRAVENOUS | Status: DC
Start: 2017-08-07 — End: 2017-08-07
  Administered 2017-08-07: 18:00:00 via INTRAVENOUS

## 2017-08-07 MED ORDER — AMIODARONE 50 MG/ML IV SOLN
50 mg/mL | INTRAVENOUS | Status: DC
Start: 2017-08-07 — End: 2017-08-10
  Administered 2017-08-07 – 2017-08-10 (×8): via INTRAVENOUS

## 2017-08-07 MED ORDER — LIDOCAINE 5 % TOPICAL OINTMENT
5 % | CUTANEOUS | Status: DC | PRN
Start: 2017-08-07 — End: 2017-08-07
  Administered 2017-08-07 (×2): via TOPICAL

## 2017-08-07 MED ORDER — DEXTROSE 5% IN WATER (D5W) IV
50 mg/mL | Freq: Once | INTRAVENOUS | Status: AC
Start: 2017-08-07 — End: 2017-08-07
  Administered 2017-08-07: 15:00:00 via INTRAVENOUS

## 2017-08-07 MED ORDER — SODIUM CHLORIDE 0.9 % IJ SYRG
Freq: Three times a day (TID) | INTRAMUSCULAR | Status: DC
Start: 2017-08-07 — End: 2017-08-10
  Administered 2017-08-07 – 2017-08-10 (×9): via INTRAVENOUS

## 2017-08-07 MED ORDER — DIGOXIN 250 MCG/ML IJ SOLN
250 mcg/mL (0.25 mg/mL) | INTRAMUSCULAR | Status: AC
Start: 2017-08-07 — End: 2017-08-08
  Administered 2017-08-07 – 2017-08-08 (×3): via INTRAVENOUS

## 2017-08-07 MED ORDER — IPRATROPIUM-ALBUTEROL 2.5 MG-0.5 MG/3 ML NEB SOLUTION
2.5 mg-0.5 mg/3 ml | RESPIRATORY_TRACT | Status: AC
Start: 2017-08-07 — End: 2017-08-07
  Administered 2017-08-07: 07:00:00

## 2017-08-07 MED ORDER — BACTERIOSTATIC SALINE 0.9 % IJ SOLN
0.9 % | INTRAMUSCULAR | Status: DC | PRN
Start: 2017-08-07 — End: 2017-08-07

## 2017-08-07 MED ORDER — DILTIAZEM HCL 5 MG/ML IV SOLN
5 mg/mL | INTRAVENOUS | Status: DC
Start: 2017-08-07 — End: 2017-08-07
  Administered 2017-08-07: 01:00:00 via INTRAVENOUS

## 2017-08-07 MED ORDER — ENOXAPARIN 80 MG/0.8 ML SUB-Q SYRINGE
80 mg/0.8 mL | Freq: Two times a day (BID) | SUBCUTANEOUS | Status: DC
Start: 2017-08-07 — End: 2017-08-10
  Administered 2017-08-07 – 2017-08-10 (×6): via SUBCUTANEOUS

## 2017-08-07 MED ORDER — FUROSEMIDE 10 MG/ML IJ SOLN
10 mg/mL | Freq: Every day | INTRAMUSCULAR | Status: DC
Start: 2017-08-07 — End: 2017-08-10
  Administered 2017-08-07 – 2017-08-09 (×3): via INTRAVENOUS

## 2017-08-07 MED ORDER — AMIODARONE 50 MG/ML IV SOLN
50 mg/mL | Freq: Once | INTRAVENOUS | Status: AC
Start: 2017-08-07 — End: 2017-08-07
  Administered 2017-08-07: 22:00:00 via INTRAVENOUS

## 2017-08-07 MED ORDER — ARFORMOTEROL 15 MCG/2 ML NEB SOLUTION
15 mcg/2 mL | Freq: Two times a day (BID) | RESPIRATORY_TRACT | Status: DC
Start: 2017-08-07 — End: 2017-08-08
  Administered 2017-08-08 (×2): via RESPIRATORY_TRACT

## 2017-08-07 MED ORDER — LIDOCAINE 2 % MUCOUS MEMBRANE JELLY IN APPLICATOR
2 % | Freq: Once | Status: AC
Start: 2017-08-07 — End: 2017-08-07
  Administered 2017-08-07: 18:00:00

## 2017-08-07 MED ORDER — ZOLPIDEM 5 MG TAB
5 mg | Freq: Every evening | ORAL | Status: DC | PRN
Start: 2017-08-07 — End: 2017-08-10

## 2017-08-07 MED ORDER — MIDAZOLAM 1 MG/ML IJ SOLN
1 mg/mL | INTRAMUSCULAR | Status: DC | PRN
Start: 2017-08-07 — End: 2017-08-07
  Administered 2017-08-07: 18:00:00 via INTRAVENOUS

## 2017-08-07 MED ORDER — DIGOXIN 250 MCG/ML IJ SOLN
250 mcg/mL (0.25 mg/mL) | Freq: Once | INTRAMUSCULAR | Status: AC
Start: 2017-08-07 — End: 2017-08-07
  Administered 2017-08-07: 13:00:00 via INTRAVENOUS

## 2017-08-07 MED ORDER — DIGOXIN 250 MCG/ML IJ SOLN
250 mcg/mL (0.25 mg/mL) | Freq: Once | INTRAMUSCULAR | Status: DC
Start: 2017-08-07 — End: 2017-08-07
  Administered 2017-08-07: 12:00:00 via INTRAVENOUS

## 2017-08-07 MED ORDER — HYDROMORPHONE (PF) 2 MG/ML IJ SOLN
2 mg/mL | INTRAMUSCULAR | Status: DC | PRN
Start: 2017-08-07 — End: 2017-08-10

## 2017-08-07 MED FILL — BACTERIOSTATIC SALINE 0.9 % IJ SOLN: 0.9 % | INTRAMUSCULAR | Qty: 10

## 2017-08-07 MED FILL — BD POSIFLUSH NORMAL SALINE 0.9 % INJECTION SYRINGE: INTRAMUSCULAR | Qty: 40

## 2017-08-07 MED FILL — MIDAZOLAM 1 MG/ML IJ SOLN: 1 mg/mL | INTRAMUSCULAR | Qty: 10

## 2017-08-07 MED FILL — IPRATROPIUM BROMIDE 0.02 % SOLN FOR INHALATION: 0.02 % | RESPIRATORY_TRACT | Qty: 2.5

## 2017-08-07 MED FILL — ENOXAPARIN 80 MG/0.8 ML SUB-Q SYRINGE: 80 mg/0.8 mL | SUBCUTANEOUS | Qty: 0.8

## 2017-08-07 MED FILL — DILTIAZEM HCL 5 MG/ML IV SOLN: 5 mg/mL | INTRAVENOUS | Qty: 25

## 2017-08-07 MED FILL — LIDOCAINE 2 % MUCOUS MEMBRANE JELLY IN APPLICATOR: 2 % | Qty: 10

## 2017-08-07 MED FILL — AMIODARONE 50 MG/ML IV SOLN: 50 mg/mL | INTRAVENOUS | Qty: 3

## 2017-08-07 MED FILL — AMIODARONE 50 MG/ML IV SOLN: 50 mg/mL | INTRAVENOUS | Qty: 7.5

## 2017-08-07 MED FILL — LIDOCAINE 5 % TOPICAL OINTMENT: 5 % | CUTANEOUS | Qty: 0.11

## 2017-08-07 MED FILL — DIGOXIN 250 MCG/ML IJ SOLN: 250 mcg/mL (0.25 mg/mL) | INTRAMUSCULAR | Qty: 2

## 2017-08-07 MED FILL — FENTANYL CITRATE (PF) 50 MCG/ML IJ SOLN: 50 mcg/mL | INTRAMUSCULAR | Qty: 2

## 2017-08-07 MED FILL — MAGNESIUM SULFATE 2 GRAM/50 ML IVPB: 2 gram/50 mL (4 %) | INTRAVENOUS | Qty: 50

## 2017-08-07 MED FILL — SOLU-MEDROL (PF) 125 MG/2 ML SOLUTION FOR INJECTION: 125 mg/2 mL | INTRAMUSCULAR | Qty: 2

## 2017-08-07 MED FILL — SODIUM CHLORIDE 0.9 % IV: INTRAVENOUS | Qty: 250

## 2017-08-07 MED FILL — ALBUTEROL SULFATE 0.083 % (0.83 MG/ML) SOLN FOR INHALATION: 2.5 mg /3 mL (0.083 %) | RESPIRATORY_TRACT | Qty: 1

## 2017-08-07 MED FILL — DILTIAZEM HCL 5 MG/ML IV SOLN: 5 mg/mL | INTRAVENOUS | Qty: 5

## 2017-08-07 MED FILL — IPRATROPIUM-ALBUTEROL 2.5 MG-0.5 MG/3 ML NEB SOLUTION: 2.5 mg-0.5 mg/3 ml | RESPIRATORY_TRACT | Qty: 3

## 2017-08-07 MED FILL — ACETAMINOPHEN 325 MG TABLET: 325 mg | ORAL | Qty: 2

## 2017-08-07 MED FILL — LEVALBUTEROL 1.25 MG/3 ML SOLN FOR INHALATION: 1.25 mg/3 mL | RESPIRATORY_TRACT | Qty: 3

## 2017-08-07 MED FILL — FUROSEMIDE 10 MG/ML IJ SOLN: 10 mg/mL | INTRAMUSCULAR | Qty: 4

## 2017-08-07 MED FILL — LIDOCAINE VISCOUS 2 % MUCOSAL SOLUTION: 2 % | Qty: 15

## 2017-08-07 NOTE — Progress Notes (Signed)
CARDIOLOGY NOTE    Mr. Yardley has some instability in  BP on rate controlling med's and needs better rate control. Will add amiodarone and then plan cardioversion today.    Rushie Chestnut, MD

## 2017-08-07 NOTE — ED Notes (Signed)
Breath sounds are clear and the patient states he is breathing easier.

## 2017-08-07 NOTE — Consults (Signed)
See Admit H&P.

## 2017-08-07 NOTE — ED Notes (Signed)
Spoke with Dr. Milbert Coulter regarding spike in pulse. Telephonic order of Amiodarone  IV push received. May given an additional Amiodarone  IV push if no remedy in managing pulse.

## 2017-08-07 NOTE — ED Notes (Signed)
The patient was eating meal prior to NPO order, Dr. Sudie Bailey aware.

## 2017-08-07 NOTE — ED Notes (Signed)
Patient off of floor for cardioversion.

## 2017-08-07 NOTE — Progress Notes (Signed)
[facility]  [address] [place], [place] [address]  [phone]    Medical Progress Note      NAME: Steve Green   DOB:  Mar 12, 1961  MRM:  [id number]    Date/Time: 08/07/2017  12:46 PM       Subjective:     Chief Complaint: "I felt short of breath"     Pt seen and examined. Remains in a-fib with RVR. Dilt gtt started at lower dose to see if BP's can tolerate and was given IV digoxin this AM.     ROS:  (bold if positive, if negative)      SOB   Chest pain        Objective:       Vitals:          Last 24hrs VS reviewed since prior progress note. Most recent are:    Visit Vitals  BP 104/85   Pulse 87   Resp 14   Ht  (1.727 m)   Wt 74.8 kg (165 lb)   SpO2 95%   BMI 25.09 kg/m??     SpO2 Readings from Last 6 Encounters:   08/07/17 95%            Intake/Output Summary (Last 24 hours) at 08/07/2017 1246  Last data filed at 08/07/2017 1210  Gross per 24 hour   Intake ???   Output 1300 ml   Net -1300 ml          Exam:     Physical Exam:    Gen:  Well-developed, well-nourished, in no acute distress  HEENT:  Pink conjunctivae, PERRL, hearing intact to voice, moist mucous membranes  Neck:  Supple, without masses, thyroid non-tender  Resp: Diffuse crackles and scattered wheezes   Card: Tachycardic. Irregularly irregular   Abd:  Soft, non-tender, non-distended, normoactive bowel sounds are present  Musc:  No cyanosis or clubbing  Skin:  No rashes or ulcers, skin turgor is good  Neuro:  Cranial nerves 3-12 are grossly intact, grip strength is 5/5 bilaterally and dorsi / plantarflexion is 5/5 bilaterally, follows commands appropriately  Psych:  Good insight, oriented to person, place and time, alert  Trace bilateral LE edema     Medications Reviewed: (see below)    Lab Data Reviewed: (see below)    ______________________________________________________________________    Medications:     Current Facility-Administered Medications   Medication Dose Route Frequency    ??? dilTIAZem (CARDIZEM) 125 mg in dextrose 5% 125 mL infusion  0-15 mg/hr IntraVENous TITRATE   ??? furosemide (LASIX) injection 20 mg  20 mg IntraVENous DAILY   ??? amiodarone (CORDARONE) 375 mg in dextrose 5% 250 mL infusion  0.5-1 mg/min IntraVENous TITRATE   ??? enoxaparin (LOVENOX) injection 70 mg  1 mg/kg SubCUTAneous Q12H   ??? sodium chloride (NS) flush 5-40 mL  5-40 mL IntraVENous Q8H   ??? sodium chloride (NS) flush 5-40 mL  5-40 mL IntraVENous PRN   ??? sodium chloride (NS) flush 5-40 mL  5-40 mL IntraVENous Q8H   ??? sodium chloride (NS) flush 5-40 mL  5-40 mL IntraVENous PRN   ??? acetaminophen (TYLENOL) tablet 650 mg  650 mg Oral Q4H PRN   ??? oxyCODONE-acetaminophen (PERCOCET) 5-325 mg per tablet 1 Tab  1 Tab Oral Q4H PRN   ??? HYDROmorphone (PF) (DILAUDID) injection 0.5 mg  0.5 mg IntraVENous Q4H PRN   ??? prochlorperazine (COMPAZINE) injection 10 mg  10 mg IntraVENous Q6H  PRN   ??? zolpidem (AMBIEN) tablet 5 mg  5 mg Oral QHS PRN     Current Outpatient Medications   Medication Sig   ??? guaiFENesin ER (MUCINEX) 600 mg ER tablet Take 600 mg by mouth two (2) times daily as needed for Congestion.   ??? acetaminophen (TYLENOL) 325 mg tablet Take 325 mg by mouth every six (6) hours as needed for Pain.            Lab Review:     Recent Labs     08/07/17  0309 08/06/17  2020   WBC 6.3 6.2   HGB 13.7 14.6   HCT 40.0 43.3   PLT 175 181     Recent Labs     08/07/17  0309 08/06/17  2020   NA 140 142   K 4.2 4.2   CL 113* 114*   CO2 20* 21   GLU 105* 102*   BUN 16 16   CREA 0.91 1.06   CA 8.6 8.8   MG 1.7 1.7   PHOS 3.4  --    ALB  --  3.3*   SGOT  --  52*   ALT  --  116*     No components found for: [facility]         Assessment / Plan:   Atrial fibrillation with RVR (HCC) (08/06/2017) - resume dilt gtt at reduced rate   -appreciate cardiology eval; currently on amiodarone   -continue Lovenox   -CHAD2vasc score => 0 unless DM is unveiled   -plans for TEE and cardioversion this afternoon; pt currently NPO        Chest pain (08/06/2017) - ?2/2 a-fib   -TTE pending   -defer additional ischemic work-up to cardiology       Hyperglycemia (08/06/2017) - A1c 5.9   -monitor       Elevated troponin (08/06/2017) - likely type 2 MI from demand from a-fib   -currently stable   -as above; ischemic work-up as per cardiology    Total time spent with patient: 66 Minutes                  Care Plan discussed with: Patient, Family and Nursing Staff    Discussed:  Code Status, Care Plan and D/C Planning    Prophylaxis:  Lovenox    Disposition:  Home w/Family           ___________________________________________________    Attending Physician: Gaspar Bidding, MD

## 2017-08-07 NOTE — Progress Notes (Signed)
TRANSFER - OUT REPORT:    Verbal report given to Dorene Sorrow RN(name) on Steve Green being transferred to Emergency room(unit) for ordered procedure       Report consisted of patient's Situation, Background, Assessment and   Recommendations(SBAR).     Information from the following report(s) SBAR was reviewed with the receiving nurse.    Opportunity for questions and clarification was provided.      Patient transported with:   Monitor  Registered Nurse

## 2017-08-07 NOTE — ED Notes (Signed)
Patient is able to tolerate oral fluids sans difficulty.

## 2017-08-07 NOTE — Progress Notes (Signed)
TTE noted with signficantly reduced EF. Defer to cardiology. Possible rate related. Likely will need repeat TTE after cardioversion to reassess EF.

## 2017-08-07 NOTE — Progress Notes (Signed)
08/07/2017  11:55 AM    Reason for Admission:   AFib w/ RVR, SOB   pt is a 57 y.o.  African American male who is admitted with   Afib with RVR.  pt  presented to the Emergency Department yesterday  PM complaining of SOB: for the past week, intermittent, worse with exertion, associated with cough  PMH: unremarkable                   RRAT Score:        4             Plan for utilizing home health:      No history, none indicated for this admission                    Current Advanced Directive/Advance Care Plan:   pt does not have ACP    Likelihood of Readmission:  Low/Green                         Transition of Care Plan:                    [facility] admission for medical management  2. Cardiology consult: ECHO, troponins, TEE, Cardioversion  3. Establish pt w/ PCP  4. D/C to home w/ cardiology f/u when stable    CM met w/ pt to begin d/c planning, charted demographics verified, pt lives w/ mother and sister Aram Beecham [phone] in 2 story home w/ 6 entry steps and 13 interior steps to bed/bath.  Reports to be ambulatory, independent ADLs relies on Medicaid or family/friends for transport.  PCP: pt has no current PCP, was assigned by insurance but has never seen  Rx: pt has Medicaid, prefers [employer] [facility]  DME: None  HH: No history    Care Management Interventions  PCP Verified by CM: No(pt has no current PCP)  Palliative Care Criteria Met (RRAT>21 & CHF Dx)?: No  Mode of Transport at Discharge: Self(Sister will transport)  Physical Therapy Consult: No  Occupational Therapy Consult: No  Current Support Network: Relative's Home(pt lives w/ mother and sister  in pvt residence, reports to be ambulatory, indpendent ADLs relies on [place]  or Medicaidfor transports )  Confirm Follow Up Transport: Other (see comment)(Medicaid)  Discharge Location  Discharge Placement: Home    Plan for TEE and  cardioversion today.  CM provided pt w/ list of [facility] PC[facility]    Family will transport at d/c     Alinda Dooms  Case Freight forwarder

## 2017-08-07 NOTE — Progress Notes (Signed)
HR still in 140s despite two amiodarone boluses  Will give more digoxin given soft BPs and low EF with HF    Doreene Adas, MD

## 2017-08-07 NOTE — ED Notes (Signed)
The patient used bedside commode for BM, normal, able to stand and ambulate without difficulty.

## 2017-08-07 NOTE — Consults (Signed)
Cardiology Consultation Note                               Steve Leriche A. Jameah Rouser, MD, [facility]                                         [address], [place], [place] [address]                         Phone [phone]; Fax [phone]            08/06/2017  8:17 PM  None  DOB:  [birth year]   MRN:  [id number]     CC: SOB  Reason for consult:??atrial flutter and CHF  Admission Diagnosis: Atrial fibrillation with RVR (HCC) [I48.91]    ASSESSMENT/RECOMMENDATIONS:   1)SOB  -etiology unclear but may be related to RVR.  -favor checking echo to ensure he does not have a CM  -continue follow troponin's and if EF low will need cardiac cath left and right  -will arrange right and left for tomorrow  -Give Bumex IV if sats fall and he is symptomatic    2) atrial flutter  -duration unknown  -rate control will be essential.  -Cardizem gtt  -If BP falls could give digoxin 0.25 mg IV x1  -ensure K and Mg are normal  -Lovenox 1 mmg/kg SubQ BID and may need NOAC at discharge  -may consider T   TEE and cardioversion today    3) Screening cholesterol  -would order          H/H 14/6/43, Na 142, K 4.2, BUN/Cre 16/1.06, troponoin 0.08, pBNP 8,904  ONG:[id number] and interstitial lung disease. CHF pattern of pulmonary edema is  favored over nonspecific cardiomegaly and interstitial pneumonia. Small left  pleural effusion. Recommend followup PA and lateral chest views in 8-10 weeks to  ensure resolution.  ECG: atrial flutter with rapid ventricular rate.         Steve Green is a 57 y.o.  African American male who is admitted with Afib with RVR.  Steve Green presented to the Emergency Department this PM complaining of SOB: for the past week, intermittent, worse with exertion, associated with cough. He states his sx's started a week ago. He has noted orthopnea and PND. Associated sx's include chest heaviness. He has stated he ses no LEE but some gas and abdominal bloating. 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 This has been over the last three days. He has had cold sx's and has taken  Mucinex Dm and robitussin. He does not stay hydrated and will drink one beer every night.Marland Kitchen +FMHx, + tobacco, Unknown cholesterol.   ??    No Known Allergies      No past medical history on file.     No past surgical history on file.     .Home Medications:  Prior to Admission Medications   Prescriptions Last Dose Informant Patient Reported? Taking?   acetaminophen (TYLENOL) 325 mg tablet  Self Yes Yes   Sig: Take 325 mg by mouth every six (6) hours as needed for Pain.   guaiFENesin ER (MUCINEX) 600 mg ER tablet  Self Yes Yes   Sig: Take 600 mg by mouth two (2) times  daily as needed for Congestion.      Facility-Administered Medications: None       Hospital Medications:  Current Facility-Administered Medications   Medication Dose Route Frequency   ??? dilTIAZem (CARDIZEM) 125 mg in dextrose 5% 125 mL infusion  0-15 mg/hr IntraVENous TITRATE   ??? furosemide (LASIX) injection 20 mg  20 mg IntraVENous DAILY   ??? sodium chloride (NS) flush 5-40 mL  5-40 mL IntraVENous Q8H   ??? sodium chloride (NS) flush 5-40 mL  5-40 mL IntraVENous PRN   ??? sodium chloride (NS) flush 5-40 mL  5-40 mL IntraVENous Q8H   ??? sodium chloride (NS) flush 5-40 mL  5-40 mL IntraVENous PRN   ??? acetaminophen (TYLENOL) tablet 650 mg  650 mg Oral Q4H PRN   ??? oxyCODONE-acetaminophen (PERCOCET) 5-325 mg per tablet 1 Tab  1 Tab Oral Q4H PRN   ??? HYDROmorphone (PF) (DILAUDID) injection 0.5 mg  0.5 mg IntraVENous Q4H PRN   ??? prochlorperazine (COMPAZINE) injection 10 mg  10 mg IntraVENous Q6H PRN   ??? zolpidem (AMBIEN) tablet 5 mg  5 mg Oral QHS PRN   ??? enoxaparin (LOVENOX) injection 70 mg  1 mg/kg SubCUTAneous Q12H     Current Outpatient Medications   Medication Sig   ??? guaiFENesin ER (MUCINEX) 600 mg ER tablet Take 600 mg by mouth two (2) times daily as needed for Congestion.    ??? acetaminophen (TYLENOL) 325 mg tablet Take 325 mg by mouth every six (6) hours as needed for Pain.          OBJECTIVE     Laboratory and Imaging have been reviewed and are notable for      ECG:  Date:  normal EKG, normal sinus rhythm, unchanged from previous tracings      Diagnostic Tests:     Recent Labs     08/07/17  0309   TROIQ 0.08*     Recent Labs     08/07/17  0309 08/06/17  2020   NA 140 142   K 4.2 4.2   CO2 20* 21   BUN 16 16   CREA 0.91 1.06   GLU 105* 102*   PHOS 3.4  --    MG 1.7 1.7   WBC 6.3 6.2   HGB 13.7 14.6   HCT 40.0 43.3   PLT 175 181         Cardiac work up to date:                   Social History:  Social History     Tobacco Use   ??? Smoking status: Current Every Day Smoker     Packs/day: 0.50   Substance Use Topics   ??? Alcohol use: Yes       Family History:  Family History   Problem Relation Age of Onset   ??? Heart Disease Other        Review of Symptoms:  A comprehensive review of systems was negative except for that written in the HPI.    Physical Exam:      Visit Vitals  BP 107/79   Pulse (!) 112   Resp 21   Ht  (1.727 m)   Wt 165 lb (74.8 kg)   SpO2 95%   BMI 25.09 kg/m??     General Appearance:  Well developed, well nourished,alert and oriented x 3, and individual in no acute distress.   Ears/Nose/Mouth/Throat:   Hearing grossly normal.Normal oral mucosa,no  scleral icterus     Neck: Supple no JVD or bruits,no cervical lymphadenopathy   Chest:   Lungs crackles and wheezing and rhonchi   Cardiovascular:  Irregular and rapid   Abdomen:   Soft, non-tender, bowel sounds are active. No abdominal bruits   Extremities: No edema bilaterally. Pulses detected, no varicosities   Skin: Warm and dry. No bruising  Neuro  Moves all extermities and neurologically intact                                                       I have discussed the diagnosis with the patient and the intended plan as seen in the above orders.  Questions were answered concerning future  plans.  I have discussed medication side effects and warnings with the patient as well.    Steve Green is in agreement to the plan listed above and wishes to proceed.     he  was instructed not to smoke, eat heart healthy diet  and to exercise.     Thank you for this consult.      Rushie Chestnut, MD

## 2017-08-07 NOTE — Consults (Signed)
Cardiology Consultation Note                                 Steve Green A. Steve Nieto, MD, [facility]                                           [address], [place], [place] [address]                         Phone [phone]; Fax [phone]            08/06/2017  8:17 PM  None  DOB:  [birth year]   MRN:  [id number]     CC: SOB  Reason for consult:??atrial flutter and CHF  Admission Diagnosis: Atrial fibrillation with RVR (HCC) [I48.91]    ASSESSMENT/RECOMMENDATIONS:   1)SOB  -etiology unclear but may be related to RVR.  -favor checking echo to ensure he does not have a CM  -continue follow troponin's and if EF low will need cardiac cath left and right  -will arrange right and left for tomorrow  -Give Bumex IV if sats fall and he is symptomatic    2) atrial flutter  -duration unknown  -rate control will be essential.  -Cardizem gtt  -If BP falls could give digoxin 0.25 mg IV x1  -ensure K and Mg are normal  -Lovenox 1 mmg/kg SubQ BID and may need NOAC at discharge  -may consider T   TEE and cardioversion today    3) Screening cholesterol  -would order          H/H 14/6/43, Na 142, K 4.2, BUN/Cre 16/1.06, troponoin 0.08, pBNP 8,904  ONG:[id number] and interstitial lung disease. CHF pattern of pulmonary edema is  favored over nonspecific cardiomegaly and interstitial pneumonia. Small left  pleural effusion. Recommend followup PA and lateral chest views in 8-10 weeks to  ensure resolution.  ECG: atrial flutter with rapid ventricular rate.         Steve Green is a 57 y.o.  African American male who is admitted with Afib with RVR.  Steve Green presented to the Emergency Department this PM complaining of SOB: for the past week, intermittent, worse with exertion, associated with cough. He states his sx's started a week ago. He has noted orthopnea and PND. Associated sx's include chest heaviness. He has stated he ses no LEE but some gas and abdominal bloating. 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 This has been over the last three days. He has had cold sx's and has taken  Mucinex Dm and robitussin. He does not stay hydrated and will drink one beer every night.Marland Kitchen +FMHx, + tobacco, Unknown cholesterol.   ??    No Known Allergies      No past medical history on file.     No past surgical history on file.     .Home Medications:  Prior to Admission Medications   Prescriptions Last Dose Informant Patient Reported? Taking?   acetaminophen (TYLENOL) 325 mg tablet  Self Yes Yes   Sig: Take 325 mg by mouth every six (6) hours as needed for Pain.   guaiFENesin ER (MUCINEX) 600 mg ER tablet  Self Yes Yes   Sig: Take 600 mg by  mouth two (2) times daily as needed for Congestion.      Facility-Administered Medications: None       Hospital Medications:  Current Facility-Administered Medications   Medication Dose Route Frequency   ??? dilTIAZem (CARDIZEM) 125 mg in dextrose 5% 125 mL infusion  0-15 mg/hr IntraVENous TITRATE   ??? furosemide (LASIX) injection 20 mg  20 mg IntraVENous DAILY   ??? sodium chloride (NS) flush 5-40 mL  5-40 mL IntraVENous Q8H   ??? sodium chloride (NS) flush 5-40 mL  5-40 mL IntraVENous PRN   ??? sodium chloride (NS) flush 5-40 mL  5-40 mL IntraVENous Q8H   ??? sodium chloride (NS) flush 5-40 mL  5-40 mL IntraVENous PRN   ??? acetaminophen (TYLENOL) tablet 650 mg  650 mg Oral Q4H PRN   ??? oxyCODONE-acetaminophen (PERCOCET) 5-325 mg per tablet 1 Tab  1 Tab Oral Q4H PRN   ??? HYDROmorphone (PF) (DILAUDID) injection 0.5 mg  0.5 mg IntraVENous Q4H PRN   ??? prochlorperazine (COMPAZINE) injection 10 mg  10 mg IntraVENous Q6H PRN   ??? zolpidem (AMBIEN) tablet 5 mg  5 mg Oral QHS PRN   ??? enoxaparin (LOVENOX) injection 70 mg  1 mg/kg SubCUTAneous Q12H     Current Outpatient Medications   Medication Sig   ??? guaiFENesin ER (MUCINEX) 600 mg ER tablet Take 600 mg by mouth two (2) times daily as needed for Congestion.   ??? acetaminophen (TYLENOL) 325 mg tablet Take 325 mg  by mouth every six (6) hours as needed for Pain.          OBJECTIVE       Laboratory and Imaging have been reviewed and are notable for      ECG:  Date:  normal EKG, normal sinus rhythm, unchanged from previous tracings      Diagnostic Tests:     Recent Labs     08/07/17  0309   TROIQ 0.08*     Recent Labs     08/07/17  0309 08/06/17  2020   NA 140 142   K 4.2 4.2   CO2 20* 21   BUN 16 16   CREA 0.91 1.06   GLU 105* 102*   PHOS 3.4  --    MG 1.7 1.7   WBC 6.3 6.2   HGB 13.7 14.6   HCT 40.0 43.3   PLT 175 181         Cardiac work up to date:                   Social History:  Social History     Tobacco Use   ??? Smoking status: Current Every Day Smoker     Packs/day: 0.50   Substance Use Topics   ??? Alcohol use: Yes       Family History:  Family History   Problem Relation Age of Onset   ??? Heart Disease Other        Review of Symptoms:  A comprehensive review of systems was negative except for that written in the HPI.    Physical Exam:      Visit Vitals  BP 107/79   Pulse (!) 112   Resp 21   Ht  (1.727 m)   Wt 165 lb (74.8 kg)   SpO2 95%   BMI 25.09 kg/m??     General Appearance:  Well developed, well nourished,alert and oriented x 3, and individual in no acute distress.   Ears/Nose/Mouth/Throat:  Hearing grossly normal.Normal oral mucosa,no scleral icterus     Neck: Supple no JVD or bruits,no cervical lymphadenopathy   Chest:   Lungs crackles and wheezing and rhonchi   Cardiovascular:  Irregular and rapid   Abdomen:   Soft, non-tender, bowel sounds are active. No abdominal bruits   Extremities: No edema bilaterally. Pulses detected, no varicosities   Skin: Warm and dry. No bruising  Neuro  Moves all extermities and neurologically intact                                                       I have discussed the diagnosis with the patient and the intended plan as seen in the above orders.  Questions were answered concerning future plans.  I have discussed medication side effects and warnings with the patient as  well.    [name] is in agreement to the plan listed above and wishes to proceed.     he  was instructed not to smoke, eat heart healthy diet  and to exercise.     Thank you for this consult.      Rushie Chestnut, MD

## 2017-08-08 LAB — EKG 12-LEAD
Atrial Rate: 86 {beats}/min
P Axis: 50 degrees
P-R Interval: 174 ms
Q-T Interval: 342 ms
QRS Duration: 92 ms
QTc Calculation (Bazett): 409 ms
R Axis: -16 degrees
T Axis: -154 degrees
Ventricular Rate: 86 {beats}/min

## 2017-08-08 LAB — CBC WITH AUTOMATED DIFF
ABS. BASOPHILS: 0 10*3/uL (ref 0.0–0.1)
ABS. EOSINOPHILS: 0 10*3/uL (ref 0.0–0.4)
ABS. IMM. GRANS.: 0 10*3/uL (ref 0.00–0.04)
ABS. LYMPHOCYTES: 0.4 10*3/uL — ABNORMAL LOW (ref 0.8–3.5)
ABS. MONOCYTES: 0.1 10*3/uL (ref 0.0–1.0)
ABS. NEUTROPHILS: 3.6 10*3/uL (ref 1.8–8.0)
ABSOLUTE NRBC: 0 10*3/uL (ref 0.00–0.01)
BASOPHILS: 0 % (ref 0–1)
EOSINOPHILS: 0 % (ref 0–7)
HCT: 37.7 % (ref 36.6–50.3)
HGB: 12.8 g/dL (ref 12.1–17.0)
IMMATURE GRANULOCYTES: 0 % (ref 0.0–0.5)
LYMPHOCYTES: 9 % — ABNORMAL LOW (ref 12–49)
MCH: 33.8 PG (ref 26.0–34.0)
MCHC: 34 g/dL (ref 30.0–36.5)
MCV: 99.5 FL — ABNORMAL HIGH (ref 80.0–99.0)
MONOCYTES: 3 % — ABNORMAL LOW (ref 5–13)
MPV: 10.6 FL (ref 8.9–12.9)
NEUTROPHILS: 88 % — ABNORMAL HIGH (ref 32–75)
NRBC: 0 PER 100 WBC
PLATELET: 156 10*3/uL (ref 150–400)
RBC: 3.79 M/uL — ABNORMAL LOW (ref 4.10–5.70)
RDW: 14.2 % (ref 11.5–14.5)
WBC: 4.1 10*3/uL (ref 4.1–11.1)

## 2017-08-08 LAB — BLOOD GAS, ARTERIAL
BASE DEFICIT: 6.6 mmol/L
BICARBONATE: 17 mmol/L — ABNORMAL LOW (ref 22–26)
O2 FLOW RATE: 4 L/min
O2 SAT: 98 % — ABNORMAL HIGH (ref 92–97)
PCO2: 31 mmHg — ABNORMAL LOW (ref 35.0–45.0)
PO2: 101 mmHg — ABNORMAL HIGH (ref 80–100)
pH: 7.37 (ref 7.35–7.45)

## 2017-08-08 LAB — EKG, 12 LEAD, INITIAL
Atrial Rate: 86 {beats}/min
Calculated P Axis: 50 degrees
Calculated R Axis: -16 degrees
Calculated T Axis: -154 degrees
P-R Interval: 174 ms
Q-T Interval: 342 ms
QRS Duration: 92 ms
QTC Calculation (Bezet): 409 ms
Ventricular Rate: 86 {beats}/min

## 2017-08-08 LAB — METABOLIC PANEL, BASIC
Anion gap: 13 mmol/L (ref 5–15)
BUN/Creatinine ratio: 14 (ref 12–20)
BUN: 13 MG/DL (ref 6–20)
CO2: 15 mmol/L — CL (ref 21–32)
Calcium: 8.1 MG/DL — ABNORMAL LOW (ref 8.5–10.1)
Chloride: 111 mmol/L — ABNORMAL HIGH (ref 97–108)
Creatinine: 0.91 MG/DL (ref 0.70–1.30)
GFR est AA: >60 mL/min/{1.73_m2} (ref >60)
GFR est non-AA: >60 mL/min/{1.73_m2} (ref >60)
Glucose: 157 mg/dL — ABNORMAL HIGH (ref 65–100)
Potassium: 4.1 mmol/L (ref 3.5–5.1)
Sodium: 139 mmol/L (ref 136–145)

## 2017-08-08 LAB — GLUCOSE, POC: Glucose (POC): 129 mg/dL — ABNORMAL HIGH (ref 65–100)

## 2017-08-08 LAB — MAGNESIUM: Magnesium: 1.8 mg/dL (ref 1.6–2.4)

## 2017-08-08 LAB — TROPONIN I: Troponin-I, Qt.: 0.07 ng/mL — ABNORMAL HIGH (ref <0.05)

## 2017-08-08 MED ORDER — GUAIFENESIN 600 MG TABLET,EXTENDED RELEASE BIPHASIC 12 HR
600 mg | Freq: Two times a day (BID) | ORAL | Status: DC
Start: 2017-08-08 — End: 2017-08-10
  Administered 2017-08-08 – 2017-08-10 (×5): via ORAL

## 2017-08-08 MED ORDER — MIDAZOLAM 1 MG/ML IJ SOLN
1 mg/mL | Freq: Once | INTRAMUSCULAR | Status: AC
Start: 2017-08-08 — End: 2017-08-08
  Administered 2017-08-08: 14:00:00 via INTRAVENOUS

## 2017-08-08 MED ORDER — METHYLPREDNISOLONE (PF) 40 MG/ML IJ SOLR
40 mg/mL | Freq: Two times a day (BID) | INTRAMUSCULAR | Status: AC
Start: 2017-08-08 — End: 2017-08-08
  Administered 2017-08-09: 02:00:00 via INTRAVENOUS

## 2017-08-08 MED ORDER — MAGNESIUM SULFATE 2 GRAM/50 ML IVPB
2 gram/50 mL (4 %) | Freq: Once | INTRAVENOUS | Status: AC
Start: 2017-08-08 — End: 2017-08-08
  Administered 2017-08-08: 13:00:00 via INTRAVENOUS

## 2017-08-08 MED ORDER — FAMOTIDINE 20 MG TAB
20 mg | Freq: Two times a day (BID) | ORAL | Status: DC
Start: 2017-08-08 — End: 2017-08-10
  Administered 2017-08-08 – 2017-08-10 (×4): via ORAL

## 2017-08-08 MED ORDER — FENTANYL CITRATE (PF) 50 MCG/ML IJ SOLN
50 mcg/mL | Freq: Once | INTRAMUSCULAR | Status: AC
Start: 2017-08-08 — End: 2017-08-08
  Administered 2017-08-08: 14:00:00 via INTRAVENOUS

## 2017-08-08 MED ORDER — FENTANYL CITRATE (PF) 50 MCG/ML IJ SOLN
50 mcg/mL | Freq: Once | INTRAMUSCULAR | Status: AC
Start: 2017-08-08 — End: 2017-08-08
  Administered 2017-08-08: 13:00:00 via INTRAVENOUS

## 2017-08-08 MED ORDER — MIDAZOLAM 1 MG/ML IJ SOLN
1 mg/mL | Freq: Once | INTRAMUSCULAR | Status: AC
Start: 2017-08-08 — End: 2017-08-08
  Administered 2017-08-08: 13:00:00 via INTRAVENOUS

## 2017-08-08 MED ORDER — LORATADINE 10 MG TAB
10 mg | Freq: Every day | ORAL | Status: DC
Start: 2017-08-08 — End: 2017-08-10
  Administered 2017-08-09 – 2017-08-10 (×2): via ORAL

## 2017-08-08 MED ORDER — PREDNISONE 20 MG TAB
20 mg | Freq: Every day | ORAL | Status: DC
Start: 2017-08-08 — End: 2017-08-10
  Administered 2017-08-09 – 2017-08-10 (×2): via ORAL

## 2017-08-08 MED ORDER — FENTANYL CITRATE (PF) 50 MCG/ML IJ SOLN
50 mcg/mL | Freq: Once | INTRAMUSCULAR | Status: DC
Start: 2017-08-08 — End: 2017-08-08

## 2017-08-08 MED ORDER — MIDAZOLAM 1 MG/ML IJ SOLN
1 mg/mL | INTRAMUSCULAR | Status: DC
Start: 2017-08-08 — End: 2017-08-08

## 2017-08-08 MED ORDER — MIDAZOLAM 5 MG/ML IJ SOLN
5 mg/mL | Freq: Once | INTRAMUSCULAR | Status: AC
Start: 2017-08-08 — End: 2017-08-08
  Administered 2017-08-08: 14:00:00 via INTRAVENOUS

## 2017-08-08 MED FILL — IPRATROPIUM BROMIDE 0.02 % SOLN FOR INHALATION: 0.02 % | RESPIRATORY_TRACT | Qty: 2.5

## 2017-08-08 MED FILL — SOLU-MEDROL (PF) 125 MG/2 ML SOLUTION FOR INJECTION: 125 mg/2 mL | INTRAMUSCULAR | Qty: 2

## 2017-08-08 MED FILL — LEVALBUTEROL 1.25 MG/3 ML SOLN FOR INHALATION: 1.25 mg/3 mL | RESPIRATORY_TRACT | Qty: 3

## 2017-08-08 MED FILL — AMIODARONE 50 MG/ML IV SOLN: 50 mg/mL | INTRAVENOUS | Qty: 7.5

## 2017-08-08 MED FILL — MUCUS RELIEF ER 600 MG TABLET, EXTENDED RELEASE: 600 mg | ORAL | Qty: 2

## 2017-08-08 MED FILL — DIGOXIN 250 MCG/ML IJ SOLN: 250 mcg/mL (0.25 mg/mL) | INTRAMUSCULAR | Qty: 2

## 2017-08-08 MED FILL — FENTANYL CITRATE (PF) 50 MCG/ML IJ SOLN: 50 mcg/mL | INTRAMUSCULAR | Qty: 2

## 2017-08-08 MED FILL — ENOXAPARIN 80 MG/0.8 ML SUB-Q SYRINGE: 80 mg/0.8 mL | SUBCUTANEOUS | Qty: 0.8

## 2017-08-08 MED FILL — BROVANA 15 MCG/2 ML SOLUTION FOR NEBULIZATION: 15 mcg/2 mL | RESPIRATORY_TRACT | Qty: 1

## 2017-08-08 MED FILL — MIDAZOLAM 1 MG/ML IJ SOLN: 1 mg/mL | INTRAMUSCULAR | Qty: 5

## 2017-08-08 MED FILL — FUROSEMIDE 10 MG/ML IJ SOLN: 10 mg/mL | INTRAMUSCULAR | Qty: 2

## 2017-08-08 MED FILL — FAMOTIDINE 20 MG TAB: 20 mg | ORAL | Qty: 1

## 2017-08-08 MED FILL — BUDESONIDE 0.5 MG/2 ML NEB SUSPENSION: 0.5 mg/2 mL | RESPIRATORY_TRACT | Qty: 1

## 2017-08-08 MED FILL — MAGNESIUM SULFATE 2 GRAM/50 ML IVPB: 2 gram/50 mL (4 %) | INTRAVENOUS | Qty: 50

## 2017-08-08 NOTE — Progress Notes (Signed)
Follow-up :    I am continuing to follow pt for discharge needs.  The plan for tomorrow is:  Right and left heart cath.  Follow pt to determine if pt will need a life vest.    Beth Aristov,RN

## 2017-08-08 NOTE — Progress Notes (Addendum)
Bedside shift change report given to Janeece Agee, Charity fundraiser (Cabin crew) by Herbert Seta, RN (offgoing nurse). Report included the following information SBAR, Kardex, Intake/Output and MAR.     0800: Assessment completed. Preparing for cardioversion per cardiology.    0930: Cardioversion w/ Dr. Lorella Nimrod. See other note for details.    1200: Verbal shift change report given to Roe Coombs, Charity fundraiser (oncoming nurse) by Janeece Agee, RN (offgoing nurse). Report included the following information SBAR, Kardex, Intake/Output and MAR.

## 2017-08-08 NOTE — Progress Notes (Signed)
Problem: Falls - Risk of  Goal: *Absence of Falls  Description  Document Steve Green Fall Risk and appropriate interventions in the flowsheet.  Outcome: Progressing Towards Goal     Problem: Patient Education: Go to Patient Education Activity  Goal: Patient/Family Education  Outcome: Progressing Towards Goal     Problem: Cardiac Output -  Decreased  Goal: *Vital signs within specified parameters  Outcome: Progressing Towards Goal  Goal: *Optimal cardiac output  Outcome: Progressing Towards Goal  Goal: *Absence of hypoxia  Outcome: Progressing Towards Goal  Goal: *Absence of peripheral edema  Outcome: Progressing Towards Goal

## 2017-08-08 NOTE — Consults (Addendum)
[facility]  Name: Steve Green MRN: [id number]   DOB: 08-04-1960 Hospital: [facility]   Date: 08/08/2017        Critical Care Initial Patient Consult    IMPRESSION:   ?? afib w/ RVR - s/p DCCV 5/1. Currently in SR  ?? ?COPD +/- acute exacerbation  ?? Cardiomyopathy (EF 21-25%), moderate MR  ?? Metabolic acidosis  ?? Tobacco/etoh abuse      RECOMMENDATIONS:   ?? Supplemental O2 as needed to keep sats > 90%  ?? Continue xopenex/atrovent nebs. Can d/c pulmicort and brovana  ?? Transition to prednisone taper in am  ?? Add claritin, mucinex and flutter valve  ?? Add pepcid for GERD  ?? Check RVP, UDS  ?? Trend LFTs  ?? For [facility] tomorrow  ?? CXR in am  ?? Replete lytes  ?? Continue lasix  ?? Needs PFTs and sleep study as an outpatient    DVT ppx: on therapeutic dose lovenox  GI ppx: pepcid    Stable for transfer out of the ICU     Subjective/History:     Seen as intensivist.    Steve Green is a 57yo male w/ no pmh who presented to the ER on 4/30 w/ complaints of shortness of breath, wheezing, chest pressure and cough productive of yellow sputum. Also, found to be in afib w/ RVR.      No prior history of asthma, COPD or OSA.  +seasonal allergies (summer worst) and GERD  Denies post nasal drip   Has symptoms suggestive of sleep apnea    FHx: negative for inheritable lung disease  SHx: smokes 1/2 ppd since age 89. Drinks a 24oz beer daily. Denies illicits. Works as a Administrator    No past medical history on file.   No past surgical history on file.   Prior to Admission medications    Medication Sig Start Date End Date Taking? Authorizing Provider   guaiFENesin ER (MUCINEX) 600 mg ER tablet Take 600 mg by mouth two (2) times daily as needed for Congestion.   Yes Provider, Historical   acetaminophen (TYLENOL) 325 mg tablet Take 325 mg by mouth every six (6) hours as needed for Pain.   Yes Provider, Historical     Current Facility-Administered Medications    Medication Dose Route Frequency   ??? furosemide (LASIX) injection 20 mg  20 mg IntraVENous DAILY   ??? amiodarone (CORDARONE) 375 mg in dextrose 5% 250 mL infusion  0.5-1 mg/min IntraVENous TITRATE   ??? enoxaparin (LOVENOX) injection 70 mg  1 mg/kg SubCUTAneous Q12H   ??? levalbuterol (XOPENEX) nebulizer soln 1.25 mg/3 mL  1.25 mg Nebulization Q4H RT   ??? ipratropium (ATROVENT) 0.02 % nebulizer solution 0.5 mg  0.5 mg Nebulization Q4H RT   ??? budesonide (PULMICORT) 500 mcg/2 ml nebulizer suspension  500 mcg Nebulization BID RT   ??? arformoterol (BROVANA) neb solution 15 mcg  15 mcg Nebulization BID RT   ??? methylPREDNISolone (PF) (Solu-MEDROL) injection 40 mg  40 mg IntraVENous Q12H   ??? sodium chloride (NS) flush 5-40 mL  5-40 mL IntraVENous Q8H   ??? sodium chloride (NS) flush 5-40 mL  5-40 mL IntraVENous Q8H     No Known Allergies   Social History     Tobacco Use   ??? Smoking status: Current Every Day Smoker     Packs/day: 0.50   Substance Use Topics   ??? Alcohol use: Yes  Family History   Problem Relation Age of Onset   ??? Heart Disease Other         Review of Systems:  A comprehensive review of systems was negative except for that written in the HPI.    Objective:  Vital Signs:    Visit Vitals  BP 112/66   Pulse 94   Temp 97.3 ??F (36.3 ??C)   Resp 24   Ht  (1.727 m)   Wt 73.1 kg (161 lb 2.5 oz)   SpO2 97%   BMI 24.50 kg/m??       O2 Device: Nasal cannula   O2 Flow Rate (L/min): 2 l/min   Temp (24hrs), Avg:97.6 ??F (36.4 ??C), Min:97.3 ??F (36.3 ??C), Max:98 ??F (36.7 ??C)     Intake/Output:   Last shift:      05/01 0701 - 05/01 1900  In: 40 [I.V.:40]  Out: 750 [Urine:750]  Last 3 shifts: 04/29 1901 - 05/01 0700  In: 80 [I.V.:80]  Out: 2250 [Urine:2250]    Intake/Output Summary (Last 24 hours) at 08/08/2017 1336  Last data filed at 08/08/2017 1118  Gross per 24 hour   Intake 120 ml   Output 1700 ml   Net -1580 ml     Hemodynamics:   PAP:   CO:     Wedge:   CI:     CVP:    SVR:       PVR:       Ventilator Settings:   Mode Rate Tidal Volume Pressure FiO2 PEEP                    Peak airway pressure:      Minute ventilation:        Physical Exam:    General:  Alert, cooperative, no distress, appears stated age.   Head:  Normocephalic, without obvious abnormality, atraumatic.   Eyes:  Conjunctivae/corneas clear. PERRL, EOMs intact.   Nose: Nares normal. Septum midline. Mucosa normal. No drainage or sinus tenderness.   Throat: Lips, mucosa, and tongue normal. Teeth and gums normal.   Neck: Supple, symmetrical, trachea midline, no adenopathy, thyroid: no enlargment/tenderness/nodules, no carotid bruit and no JVD.   Back:   Symmetric, no curvature. ROM normal.   Lungs:   Clear to auscultation bilaterally.   Chest wall:  No tenderness or deformity.   Heart:  Regular rate and rhythm, S1, S2 normal, no murmur, click, rub or gallop.   Abdomen:   Soft, non-tender. Bowel sounds normal. No masses,  No organomegaly.   Extremities: Extremities normal, atraumatic, no cyanosis or edema.   Pulses: 2+ and symmetric all extremities.   Skin: Skin color, texture, turgor normal. No rashes or lesions   Lymph nodes: Cervical, supraclavicular, and axillary nodes normal.   Neurologic: Grossly nonfocal       Data:     Recent Results (from the past 24 hour(s))   CBC WITH AUTOMATED DIFF    Collection Time: 08/08/17  4:16 AM   Result Value Ref Range    WBC 4.1 4.1 - 11.1 K/uL    RBC 3.79 (L) 4.10 - 5.70 M/uL    HGB 12.8 12.1 - 17.0 g/dL    HCT 21.3 08.6 - 57.8 %    MCV 99.5 (H) 80.0 - 99.0 FL    MCH 33.8 26.0 - 34.0 PG    MCHC 34.0 30.0 - 36.5 g/dL    RDW 46.9 62.9 - 52.8 %    PLATELET 156 150 -  400 K/uL    MPV 10.6 8.9 - 12.9 FL    NRBC 0.0 0 PER 100 WBC    ABSOLUTE NRBC 0.00 0.00 - 0.01 K/uL    NEUTROPHILS 88 (H) 32 - 75 %    LYMPHOCYTES 9 (L) 12 - 49 %    MONOCYTES 3 (L) 5 - 13 %    EOSINOPHILS 0 0 - 7 %    BASOPHILS 0 0 - 1 %    IMMATURE GRANULOCYTES 0 0.0 - 0.5 %    ABS. NEUTROPHILS 3.6 1.8 - 8.0 K/UL    ABS. LYMPHOCYTES 0.4 (L) 0.8 - 3.5 K/UL     ABS. MONOCYTES 0.1 0.0 - 1.0 K/UL    ABS. EOSINOPHILS 0.0 0.0 - 0.4 K/UL    ABS. BASOPHILS 0.0 0.0 - 0.1 K/UL    ABS. IMM. GRANS. 0.0 0.00 - 0.04 K/UL    DF SMEAR SCANNED      RBC COMMENTS NORMOCYTIC, NORMOCHROMIC     METABOLIC PANEL, BASIC    Collection Time: 08/08/17  4:16 AM   Result Value Ref Range    Sodium 139 136 - 145 mmol/L    Potassium 4.1 3.5 - 5.1 mmol/L    Chloride 111 (H) 97 - 108 mmol/L    CO2 15 (LL) 21 - 32 mmol/L    Anion gap 13 5 - 15 mmol/L    Glucose 157 (H) 65 - 100 mg/dL    BUN 13 6 - 20 MG/DL    Creatinine 1.61 0.96 - 1.30 MG/DL    BUN/Creatinine ratio 14 12 - 20      GFR est AA >60 >60 ml/min/1.65m2    GFR est non-AA >60 >60 ml/min/1.47m2    Calcium 8.1 (L) 8.5 - 10.1 MG/DL   MAGNESIUM    Collection Time: 08/08/17  4:16 AM   Result Value Ref Range    Magnesium 1.8 1.6 - 2.4 mg/dL   TROPONIN I    Collection Time: 08/08/17  4:17 AM   Result Value Ref Range    Troponin-I, Qt. 0.07 (H) <0.05 ng/mL   BLOOD GAS, ARTERIAL    Collection Time: 08/08/17  8:17 AM   Result Value Ref Range    pH 7.37 7.35 - 7.45      PCO2 31 (L) 35.0 - 45.0 mmHg    PO2 101 (H) 80 - 100 mmHg    O2 SAT 98 (H) 92 - 97 %    BICARBONATE 17 (L) 22 - 26 mmol/L    BASE DEFICIT 6.6 mmol/L    O2 METHOD NASAL O2      O2 FLOW RATE 4.00 L/min    Sample source ARTERIAL      SITE LEFT BRACHIAL      ALLEN'S TEST YES     GLUCOSE, POC    Collection Time: 08/08/17  1:07 PM   Result Value Ref Range    Glucose (POC) 129 (H) 65 - 100 mg/dL    Performed by Morrell Riddle                  Imaging:  I have personally reviewed the patient???s radiographs and have reviewed the reports:            Cira Rue, MD

## 2017-08-08 NOTE — Progress Notes (Signed)
Cardiology Cardioversion Note    Diagnosis : Atrial fib/flutter  Procedure : Cardioversion 200 J     Versed /Fentanyl    Successful cardioversion with 200 J -> NSR with occasional PVCs and PACs.    Rushie Chestnut, MD

## 2017-08-08 NOTE — ED Notes (Signed)
Telephonic report given to [facility], RN. SBAR,MAR, Labs, Cardiac Rhythm (afib/a-flutter), Amiodarone drip, NPO status, I&O, Consults and Plan discussed.

## 2017-08-08 NOTE — Progress Notes (Signed)
[facility]  [address] [place], [place] [address]  [phone]    Medical Progress Note      NAME: Steve Green   DOB:  October 07, 1960  MRM:  [id number]    Date/Time: 08/08/2017  12:46 PM       Subjective:     Chief Complaint: "I am feeling better"     Pt seen and examined. No complaints this AM. S/p cardioversion and remains in sinus rhythm. Less SOB.       ROS:  (bold if positive, if negative)      SOB improved   Chest pain resolved        Objective:       Vitals:          Last 24hrs VS reviewed since prior progress note. Most recent are:    Visit Vitals  BP 112/66   Pulse 94   Temp 97.8 ??F (36.6 ??C)   Resp 24   Ht  (1.727 m)   Wt 73.1 kg (161 lb 2.5 oz)   SpO2 97%   BMI 24.50 kg/m??     SpO2 Readings from Last 6 Encounters:   08/08/17 97%    O2 Flow Rate (L/min): 2 l/min       Intake/Output Summary (Last 24 hours) at 08/08/2017 1524  Last data filed at 08/08/2017 1200  Gross per 24 hour   Intake 160 ml   Output 1700 ml   Net -1540 ml          Exam:     Physical Exam:    Gen:  Well-developed, well-nourished, in no acute distress  HEENT:  Pink conjunctivae, PERRL, hearing intact to voice, moist mucous membranes  Neck:  Supple, without masses, thyroid non-tender  Resp:  CTA B/L. No wheezing   Card: No MRG. RRR    Abd:  Soft, non-tender, non-distended, normoactive bowel sounds are present  Musc:  No cyanosis or clubbing  Skin:  No rashes or ulcers, skin turgor is good  Neuro:  Cranial nerves 3-12 are grossly intact, grip strength is 5/5 bilaterally and dorsi / plantarflexion is 5/5 bilaterally, follows commands appropriately  Psych:  Good insight, oriented to person, place and time, alert  Trace bilateral LE edema improved     Medications Reviewed: (see below)    Lab Data Reviewed: (see below)    ______________________________________________________________________    Medications:     Current Facility-Administered Medications   Medication Dose Route Frequency    ??? methylPREDNISolone (PF) (Solu-MEDROL) injection 40 mg  40 mg IntraVENous Q12H   ??? [START ON 08/09/2017] loratadine (CLARITIN) tablet 10 mg  10 mg Oral DAILY   ??? [START ON 08/09/2017] predniSONE (DELTASONE) tablet 60 mg  60 mg Oral DAILY WITH BREAKFAST   ??? guaiFENesin ER (MUCINEX) tablet 1,200 mg  1,200 mg Oral Q12H   ??? famotidine (PEPCID) tablet 20 mg  20 mg Oral BID   ??? furosemide (LASIX) injection 20 mg  20 mg IntraVENous DAILY   ??? amiodarone (CORDARONE) 375 mg in dextrose 5% 250 mL infusion  0.5-1 mg/min IntraVENous TITRATE   ??? enoxaparin (LOVENOX) injection 70 mg  1 mg/kg SubCUTAneous Q12H   ??? levalbuterol (XOPENEX) nebulizer soln 1.25 mg/3 mL  1.25 mg Nebulization Q4H RT   ??? ipratropium (ATROVENT) 0.02 % nebulizer solution 0.5 mg  0.5 mg Nebulization Q4H RT   ??? sodium chloride (NS) flush 5-40 mL  5-40 mL IntraVENous Q8H   ???  sodium chloride (NS) flush 5-40 mL  5-40 mL IntraVENous PRN   ??? sodium chloride (NS) flush 5-40 mL  5-40 mL IntraVENous Q8H   ??? sodium chloride (NS) flush 5-40 mL  5-40 mL IntraVENous PRN   ??? acetaminophen (TYLENOL) tablet 650 mg  650 mg Oral Q4H PRN   ??? oxyCODONE-acetaminophen (PERCOCET) 5-325 mg per tablet 1 Tab  1 Tab Oral Q4H PRN   ??? HYDROmorphone (PF) (DILAUDID) injection 0.5 mg  0.5 mg IntraVENous Q4H PRN   ??? prochlorperazine (COMPAZINE) injection 10 mg  10 mg IntraVENous Q6H PRN   ??? zolpidem (AMBIEN) tablet 5 mg  5 mg Oral QHS PRN            Lab Review:     Recent Labs     08/08/17  0416 08/07/17  0309 08/06/17  2020   WBC 4.1 6.3 6.2   HGB 12.8 13.7 14.6   HCT 37.7 40.0 43.3   PLT 156 175 181     Recent Labs     08/08/17  0416 08/07/17  0309 08/06/17  2020   NA 139 140 142   K 4.1 4.2 4.2   CL 111* 113* 114*   CO2 15* 20* 21   GLU 157* 105* 102*   BUN CREA 0.91 0.91 1.06   CA 8.1* 8.6 8.8   MG 1.8 1.7 1.7   PHOS  --  3.4  --    ALB  --   --  3.3*   SGOT  --   --  52*   ALT  --   --  116*     No components found for: [facility]         Assessment / Plan:    Atrial fibrillation with RVR (HCC) (08/06/2017) - now s/p cardioversion and in NSR  -defer need for beta blocker/dilt to maintain NSR to cardiology   -defer need for anticoagulation for a-fib to cardiology; CHADS2 vasc 0 but EF markedly reduced      Chest pain (08/06/2017) - ?2/2 a-fib. Resolved   -L and R heart cath in the AM to further eval       Hyperglycemia (08/06/2017) - A1c 5.9   -monitor       Elevated troponin (08/06/2017) - likely type 2 MI from demand from a-fib   -currently stable   -as above; ischemic work-up as per cardiology as above     Systolic CHF - ?2/2 a-fib v. Underlying ischemia   -R and L heart cath in the AM   -defer need for ACE I and beta blocker to cardiology. Suspect BP may not tolerate   -continue diuresis   -strict I's and O's   -daily weights     COPD - not formally diagnosed but likely considering tobacco use   -continue nebs and steroids; weaning steroids   -counseled on cessation     Total time spent with patient: 18 Minutes                  Care Plan discussed with: Patient, Family and Nursing Staff    Discussed:  Code Status, Care Plan and D/C Planning    Prophylaxis:  Lovenox    Disposition:  Home w/Family           ___________________________________________________    Attending Physician: Gaspar Bidding, MD

## 2017-08-08 NOTE — Progress Notes (Addendum)
0930: Dr. Lorella Nimrod at the bedside for cardioversion. 2 Versed and 25 Fentanyl ordered and given. Pads and leads on pt, oxygen and suction at the bedside. Whitney RN at the bedside.    0930: 2 Versed, 25 Fentanyl ordered and given.     8119: 1 versed ordered and given.     1478: 25 Fentanyl ordered and given.    2956: 25 Fentanyl 1 Versed ordered and given.     2130: 25 Fentanyl 1 Versed ordered and given.    8657: AED charged to 200J, shocked, HR 82, pr remains drowsy.     1042: EKG completed, patient remains in NSR w/ PVCs. Patient more awake at this time.

## 2017-08-08 NOTE — Undefined (Signed)
Examined pt at bedside  No wheezing currently, no coughing.  Visit Vitals  BP 111/67   Pulse 94   Temp 97.6 ??F (36.4 ??C)   Resp 17   Ht  (1.727 m)   Wt 161 lb 2.5 oz (73.1 kg)   SpO2 98%   BMI 24.50 kg/m??     D/W Dr. Lorella Nimrod will proceed with cardioversion.

## 2017-08-08 NOTE — Progress Notes (Addendum)
0045: assumed care of pt from ED. Phone report received from wendy, rn, including sbar, mar, kardex, cardiac rhythm.     0100: pt remains on room air - denies sob, denies chest pain at this time. HR <100 at rest, but with activity HR increases to 120.  bp stable at this time. Pt aox4 denies needs at this time.     0400: pt sleeping comfortably, states he feels much better. HR controlled at this time - remains on amio gtt. Pt states he feels more wheezy when he sleeps. Denies pain, denies sob, denies chest pain.     1610: critical lab co2 of 15, md notified no new orders

## 2017-08-08 NOTE — Other (Addendum)
1200: Bedside and Verbal shift change report given to Philis Kendall, RN (oncoming nurse) by Santina Evans, RN (offgoing nurse). Report included the following information SBAR, Kardex, MAR and Cardiac Rhythm NSR.     1200 - 1630: Patient without complaints of pain. Complains of having "gas." Amiodarone at 0.5 mg/min. Educated patient on smoking cessation. Patient acknowledged understanding. Encouraged patient to call for assistance at any time. Mother and sister in to visit.   1800: Pt c/o pain to mid abdomen. States he the pain makes him "Burp" and the pain goes away.     Bedside and Verbal shift change report given to Natalia Leatherwood, Charity fundraiser (oncoming nurse) by Philis Kendall, RN (offgoing nurse). Report included the following information SBAR, Kardex, MAR and Cardiac Rhythm NSR.

## 2017-08-08 NOTE — Progress Notes (Signed)
Problem: Falls - Risk of  Goal: *Absence of Falls  Description  Document Steve Green Fall Risk and appropriate interventions in the flowsheet.  Outcome: Progressing Towards Goal  Note:   Fall Risk Interventions:     Pt aox4, compliant - reminded to use call light and not ambulate without assistance       Medication Interventions: Assess postural VS orthostatic hypotension, Bed/chair exit alarm, Evaluate medications/consider consulting pharmacy, Patient to call before getting OOB, Teach patient to arise slowly    Elimination Interventions: Bed/chair exit alarm, Call light in reach, Patient to call for help with toileting needs, Toileting schedule/hourly rounds, Urinal in reach              Problem: Patient Education: Go to Patient Education Activity  Goal: Patient/Family Education  Outcome: Progressing Towards Goal     Problem: Cardiac Output -  Decreased  Goal: *Vital signs within specified parameters  Outcome: Progressing Towards Goal  Note:   HR controled at this time - pt on amio gtt - HR increases with activity  Goal: *Absence of hypoxia  Outcome: Progressing Towards Goal  Note:   Pt remains on room air     Problem: Cardiac Output -  Decreased  Goal: *Absence of hypoxia  Outcome: Progressing Towards Goal  Note:   Pt remains on room air

## 2017-08-08 NOTE — Progress Notes (Signed)
Progress Note                                        Dakiyah Heinke A. Joseluis Alessio, MD, [facility]                                                                [address], [address], [place], [place] [address]                                                 Phone [phone]; Fax [phone]        08/08/2017 9:57 AM     Admit Date:           08/06/2017  Admit Diagnosis:  Atrial fibrillation with RVR (HCC) [I48.91]  DOB:          1960/09/15   MRN:          [id number]   ASSESSMENT/RECOMMENDATION:         1)SOB  -etiology unclear but may be related to RVR.  -EF severely reduced  -TEE negative for LAA clot  - EF low will need cardiac cath left and right  -will arrange right and left for tomorrow  -Give Bumex IV if sats fall and he is symptomatic  ??  2) atrial flutter  -cardioversion today successful with 200 J  -will continue lovenox and begin eliquis after cath    3) Screening cholesterol    ??     ??  ??  ??    No intake/output data recorded.    Last 3 Recorded Weights in this Encounter    08/07/17 0919 08/07/17 1414 08/08/17 0050   Weight: 165 lb (74.8 kg) 165 lb (74.8 kg) 161 lb 2.5 oz (73.1 kg)         04/29 1901 - 05/01 0700  In: 80 [I.V.:80]  Out: 2250 [Urine:2250]    SUBJECTIVE           Mr.??Moseley??is a 57 y.o.????African American??male??who is admitted with??Afib with RVR.????Mr.??Dimattia??presented to the Emergency Department this PM??complaining of SOB: for the past week, intermittent, worse with exertion, associated with cough. He states his sx's started a week ago. He has noted orthopnea and PND. Associated sx's include chest heaviness. He has stated he ses no LEE but some gas and abdominal bloating. 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 This has been over the last three days. He has had cold sx's and has taken  Mucinex Dm and robitussin. He does not stay hydrated and will  drink one beer every night.Marland Kitchen +FMHx, + tobacco, Unknown cholesterol.     [name] reports feeling great and good night sleep .No shortness of breath..      Current Facility-Administered Medications   Medication Dose Route Frequency   ??? fentaNYL citrate (PF) injection 25 mcg  25 mcg IntraVENous ONCE   ??? fentaNYL citrate (PF) injection 25 mcg  25 mcg IntraVENous ONCE   ??? midazolam (VERSED) injection 1 mg  1  mg IntraVENous ONCE   ??? furosemide (LASIX) injection 20 mg  20 mg IntraVENous DAILY   ??? amiodarone (CORDARONE) 375 mg in dextrose 5% 250 mL infusion  0.5-1 mg/min IntraVENous TITRATE   ??? enoxaparin (LOVENOX) injection 70 mg  1 mg/kg SubCUTAneous Q12H   ??? levalbuterol (XOPENEX) nebulizer soln 1.25 mg/3 mL  1.25 mg Nebulization Q4H RT   ??? ipratropium (ATROVENT) 0.02 % nebulizer solution 0.5 mg  0.5 mg Nebulization Q4H RT   ??? budesonide (PULMICORT) 500 mcg/2 ml nebulizer suspension  500 mcg Nebulization BID RT   ??? arformoterol (BROVANA) neb solution 15 mcg  15 mcg Nebulization BID RT   ??? methylPREDNISolone (PF) (Solu-MEDROL) injection 40 mg  40 mg IntraVENous Q12H   ??? sodium chloride (NS) flush 5-40 mL  5-40 mL IntraVENous Q8H   ??? sodium chloride (NS) flush 5-40 mL  5-40 mL IntraVENous PRN   ??? sodium chloride (NS) flush 5-40 mL  5-40 mL IntraVENous Q8H   ??? sodium chloride (NS) flush 5-40 mL  5-40 mL IntraVENous PRN   ??? acetaminophen (TYLENOL) tablet 650 mg  650 mg Oral Q4H PRN   ??? oxyCODONE-acetaminophen (PERCOCET) 5-325 mg per tablet 1 Tab  1 Tab Oral Q4H PRN   ??? HYDROmorphone (PF) (DILAUDID) injection 0.5 mg  0.5 mg IntraVENous Q4H PRN   ??? prochlorperazine (COMPAZINE) injection 10 mg  10 mg IntraVENous Q6H PRN   ??? zolpidem (AMBIEN) tablet 5 mg  5 mg Oral QHS PRN      OBJECTIVE             Intake/Output Summary (Last 24 hours) at 08/08/2017 0957  Last data filed at 08/08/2017 0400  Gross per 24 hour   Intake 80 ml   Output 2250 ml   Net -2170 ml        Review of Systems - History obtained from the patient AS PER  HPI        PHYSICAL EXAM        Visit Vitals  BP 103/54   Pulse (!) 106   Temp 97.3 ??F (36.3 ??C)   Resp 24   Ht  (1.727 m)   Wt 161 lb 2.5 oz (73.1 kg)   SpO2 91%   BMI 24.50 kg/m??       Gen: Well-developed, well-nourished, in no acute distress  alert and oriented x 3  HEENT:  Pink conjunctivae, Hearing grossly normal.No scleral icterus or conjunctival, moist mucous membranes  Neck: Supple,No JVD, No Carotid Bruit, Thyroid- non tender No cervical lymphadenopathy  Resp: No accessory muscle use, Clear breath sounds, No rales or rhonchi  Card: Rapid and irregular  MSK: No cyanosis or clubbing, good capillary refill  Skin: No rashes or ulcers, no bruising  Neuro:  Cranial nerves are grossly intact, moving all four extremities, no focal deficit, follows commands appropriately  Psych:  fair insight, oriented to person, place and time, alert, Nml Affect  LE: No edema       DATA REVIEW            Laboratory and Imaging have been reviewed by me and are notable for  Recent Labs     08/08/17  0417 08/07/17  1123 08/07/17  0309   TROIQ 0.07* 0.09* 0.08*     Recent Labs     08/08/17  0416 08/07/17  0309 08/06/17  2020   NA 139 140 142   K 4.1 4.2 4.2   CO2 15* 20* 21  BUN CREA 0.91 0.91 1.06   GLU 157* 105* 102*   PHOS  --  3.4  --    MG 1.8 1.7 1.7   WBC 4.1 6.3 6.2   HGB 12.8 13.7 14.6   HCT 37.7 40.0 43.3   PLT 156 175 181             Rushie Chestnut, MD

## 2017-08-08 NOTE — Progress Notes (Signed)
1930: Bedside and Verbal shift change report given to Donalynn Furlong RN (oncoming nurse) by Morrell Riddle RN (offgoing nurse). Report included the following information SBAR, Kardex, Procedure Summary, Intake/Output, MAR, Recent Results, Med Rec Status and Cardiac Rhythm NSR .     2000: Shift assessment noted, see flow sheet. AxOx4, on 2L NC, afebrile, NSR with PVCs on monitor, no complaints of chest pain or dizziness. Amiodarone infusing at 0.5. Family at bedside, questions answered, call bell within reach.     0000: No new changes.     0400: No new changes, lab work sent for processing.

## 2017-08-08 NOTE — Consults (Signed)
[facility]  Pulmonary, Critical Care, and Sleep Medicine    Name: Steve Green MRN: [id number]   DOB: 1960-12-22 Hospital: [facility]   Date: 08/08/2017        Critical Care Initial Patient Consult    IMPRESSION:   ?? afib w/ RVR - s/p DCCV 5/1. Currently in SR  ?? ?COPD +/- acute exacerbation  ?? Cardiomyopathy (EF 21-25%), moderate MR  ?? Metabolic acidosis  ?? Tobacco/etoh abuse      RECOMMENDATIONS:   ?? Supplemental O2 as needed to keep sats > 90%  ?? Continue xopenex/atrovent nebs. Can d/c pulmicort and brovana  ?? Transition to prednisone taper in am  ?? Add claritin, mucinex and flutter valve  ?? Add pepcid for GERD  ?? Check RVP, UDS  ?? Trend LFTs  ?? For [facility] tomorrow  ?? CXR in am  ?? Replete lytes  ?? Continue lasix  ?? Needs PFTs and sleep study as an outpatient    DVT ppx: on therapeutic dose lovenox  GI ppx: pepcid    Stable for transfer out of the ICU     Subjective/History:     Seen as intensivist.    Steve Green is a 57yo male w/ no pmh who presented to the ER on 4/30 w/ complaints of shortness of breath, wheezing, chest pressure and cough productive of yellow sputum. Also, found to be in afib w/ RVR.      No prior history of asthma, COPD or OSA.  +seasonal allergies (summer worst) and GERD  Denies post nasal drip   Has symptoms suggestive of sleep apnea    FHx: negative for inheritable lung disease  SHx: smokes 1/2 ppd since age 17. Drinks a 24oz beer daily. Denies illicits. Works as a Administrator    No past medical history on file.   No past surgical history on file.   Prior to Admission medications    Medication Sig Start Date End Date Taking? Authorizing Provider   guaiFENesin ER (MUCINEX) 600 mg ER tablet Take 600 mg by mouth two (2) times daily as needed for Congestion.   Yes Provider, Historical   acetaminophen (TYLENOL) 325 mg tablet Take 325 mg by mouth every six (6) hours as needed for Pain.   Yes Provider, Historical     Current Facility-Administered Medications    Medication Dose Route Frequency   ??? furosemide (LASIX) injection 20 mg  20 mg IntraVENous DAILY   ??? amiodarone (CORDARONE) 375 mg in dextrose 5% 250 mL infusion  0.5-1 mg/min IntraVENous TITRATE   ??? enoxaparin (LOVENOX) injection 70 mg  1 mg/kg SubCUTAneous Q12H   ??? levalbuterol (XOPENEX) nebulizer soln 1.25 mg/3 mL  1.25 mg Nebulization Q4H RT   ??? ipratropium (ATROVENT) 0.02 % nebulizer solution 0.5 mg  0.5 mg Nebulization Q4H RT   ??? budesonide (PULMICORT) 500 mcg/2 ml nebulizer suspension  500 mcg Nebulization BID RT   ??? arformoterol (BROVANA) neb solution 15 mcg  15 mcg Nebulization BID RT   ??? methylPREDNISolone (PF) (Solu-MEDROL) injection 40 mg  40 mg IntraVENous Q12H   ??? sodium chloride (NS) flush 5-40 mL  5-40 mL IntraVENous Q8H   ??? sodium chloride (NS) flush 5-40 mL  5-40 mL IntraVENous Q8H     No Known Allergies   Social History     Tobacco Use   ??? Smoking status: Current Every Day Smoker     Packs/day: 0.50   Substance Use Topics   ??? Alcohol  use: Yes      Family History   Problem Relation Age of Onset   ??? Heart Disease Other         Review of Systems:  A comprehensive review of systems was negative except for that written in the HPI.    Objective:   Vital Signs:    Visit Vitals  BP 112/66   Pulse 94   Temp 97.3 ??F (36.3 ??C)   Resp 24   Ht  (1.727 m)   Wt 73.1 kg (161 lb 2.5 oz)   SpO2 97%   BMI 24.50 kg/m??       O2 Device: Nasal cannula   O2 Flow Rate (L/min): 2 l/min   Temp (24hrs), Avg:97.6 ??F (36.4 ??C), Min:97.3 ??F (36.3 ??C), Max:98 ??F (36.7 ??C)       Intake/Output:   Last shift:      05/01 0701 - 05/01 1900  In: 40 [I.V.:40]  Out: 750 [Urine:750]  Last 3 shifts: 04/29 1901 - 05/01 0700  In: 80 [I.V.:80]  Out: 2250 [Urine:2250]    Intake/Output Summary (Last 24 hours) at 08/08/2017 1336  Last data filed at 08/08/2017 1118  Gross per 24 hour   Intake 120 ml   Output 1700 ml   Net -1580 ml     Hemodynamics:   PAP:   CO:     Wedge:   CI:     CVP:    SVR:       PVR:       Ventilator Settings:  Mode Rate  Tidal Volume Pressure FiO2 PEEP                    Peak airway pressure:      Minute ventilation:        Physical Exam:    General:  Alert, cooperative, no distress, appears stated age.   Head:  Normocephalic, without obvious abnormality, atraumatic.   Eyes:  Conjunctivae/corneas clear. PERRL, EOMs intact.   Nose: Nares normal. Septum midline. Mucosa normal. No drainage or sinus tenderness.   Throat: Lips, mucosa, and tongue normal. Teeth and gums normal.   Neck: Supple, symmetrical, trachea midline, no adenopathy, thyroid: no enlargment/tenderness/nodules, no carotid bruit and no JVD.   Back:   Symmetric, no curvature. ROM normal.   Lungs:   Clear to auscultation bilaterally.   Chest wall:  No tenderness or deformity.   Heart:  Regular rate and rhythm, S1, S2 normal, no murmur, click, rub or gallop.   Abdomen:   Soft, non-tender. Bowel sounds normal. No masses,  No organomegaly.   Extremities: Extremities normal, atraumatic, no cyanosis or edema.   Pulses: 2+ and symmetric all extremities.   Skin: Skin color, texture, turgor normal. No rashes or lesions   Lymph nodes: Cervical, supraclavicular, and axillary nodes normal.   Neurologic: Grossly nonfocal       Data:     Recent Results (from the past 24 hour(s))   CBC WITH AUTOMATED DIFF    Collection Time: 08/08/17  4:16 AM   Result Value Ref Range    WBC 4.1 4.1 - 11.1 K/uL    RBC 3.79 (L) 4.10 - 5.70 M/uL    HGB 12.8 12.1 - 17.0 g/dL    HCT 16.1 09.6 - 04.5 %    MCV 99.5 (H) 80.0 - 99.0 FL    MCH 33.8 26.0 - 34.0 PG    MCHC 34.0 30.0 - 36.5 g/dL    RDW 40.9  11.5 - 14.5 %    PLATELET 156 150 - 400 K/uL    MPV 10.6 8.9 - 12.9 FL    NRBC 0.0 0 PER 100 WBC    ABSOLUTE NRBC 0.00 0.00 - 0.01 K/uL    NEUTROPHILS 88 (H) 32 - 75 %    LYMPHOCYTES 9 (L) 12 - 49 %    MONOCYTES 3 (L) 5 - 13 %    EOSINOPHILS 0 0 - 7 %    BASOPHILS 0 0 - 1 %    IMMATURE GRANULOCYTES 0 0.0 - 0.5 %    ABS. NEUTROPHILS 3.6 1.8 - 8.0 K/UL    ABS. LYMPHOCYTES 0.4 (L) 0.8 - 3.5 K/UL    ABS. MONOCYTES  0.1 0.0 - 1.0 K/UL    ABS. EOSINOPHILS 0.0 0.0 - 0.4 K/UL    ABS. BASOPHILS 0.0 0.0 - 0.1 K/UL    ABS. IMM. GRANS. 0.0 0.00 - 0.04 K/UL    DF SMEAR SCANNED      RBC COMMENTS NORMOCYTIC, NORMOCHROMIC     METABOLIC PANEL, BASIC    Collection Time: 08/08/17  4:16 AM   Result Value Ref Range    Sodium 139 136 - 145 mmol/L    Potassium 4.1 3.5 - 5.1 mmol/L    Chloride 111 (H) 97 - 108 mmol/L    CO2 15 (LL) 21 - 32 mmol/L    Anion gap 13 5 - 15 mmol/L    Glucose 157 (H) 65 - 100 mg/dL    BUN 13 6 - 20 MG/DL    Creatinine 1.47 8.29 - 1.30 MG/DL    BUN/Creatinine ratio 14 12 - 20      GFR est AA >60 >60 ml/min/1.28m2    GFR est non-AA >60 >60 ml/min/1.72m2    Calcium 8.1 (L) 8.5 - 10.1 MG/DL   MAGNESIUM    Collection Time: 08/08/17  4:16 AM   Result Value Ref Range    Magnesium 1.8 1.6 - 2.4 mg/dL   TROPONIN I    Collection Time: 08/08/17  4:17 AM   Result Value Ref Range    Troponin-I, Qt. 0.07 (H) <0.05 ng/mL   BLOOD GAS, ARTERIAL    Collection Time: 08/08/17  8:17 AM   Result Value Ref Range    pH 7.37 7.35 - 7.45      PCO2 31 (L) 35.0 - 45.0 mmHg    PO2 101 (H) 80 - 100 mmHg    O2 SAT 98 (H) 92 - 97 %    BICARBONATE 17 (L) 22 - 26 mmol/L    BASE DEFICIT 6.6 mmol/L    O2 METHOD NASAL O2      O2 FLOW RATE 4.00 L/min    Sample source ARTERIAL      SITE LEFT BRACHIAL      ALLEN'S TEST YES     GLUCOSE, POC    Collection Time: 08/08/17  1:07 PM   Result Value Ref Range    Glucose (POC) 129 (H) 65 - 100 mg/dL    Performed by Morrell Riddle                  Imaging:  I have personally reviewed the patient???s radiographs and have reviewed the reports:            Cira Rue, MD

## 2017-08-09 ENCOUNTER — Inpatient Hospital Stay: Admit: 2017-08-09 | Payer: MEDICAID | Primary: Student in an Organized Health Care Education/Training Program

## 2017-08-09 LAB — METABOLIC PANEL, BASIC
Anion gap: 7 mmol/L (ref 5–15)
BUN/Creatinine ratio: 16 (ref 12–20)
BUN: 16 MG/DL (ref 6–20)
CO2: 23 mmol/L (ref 21–32)
Calcium: 8.7 MG/DL (ref 8.5–10.1)
Chloride: 107 mmol/L (ref 97–108)
Creatinine: 0.97 MG/DL (ref 0.70–1.30)
GFR est AA: >60 mL/min/{1.73_m2} (ref >60)
GFR est non-AA: >60 mL/min/{1.73_m2} (ref >60)
Glucose: 138 mg/dL — ABNORMAL HIGH (ref 65–100)
Potassium: 4.2 mmol/L (ref 3.5–5.1)
Sodium: 137 mmol/L (ref 136–145)

## 2017-08-09 LAB — CBC WITH AUTOMATED DIFF
ABS. BASOPHILS: 0 10*3/uL (ref 0.0–0.1)
ABS. EOSINOPHILS: 0 10*3/uL (ref 0.0–0.4)
ABS. IMM. GRANS.: 0 10*3/uL (ref 0.00–0.04)
ABS. LYMPHOCYTES: 0.4 10*3/uL — ABNORMAL LOW (ref 0.8–3.5)
ABS. MONOCYTES: 0.4 10*3/uL (ref 0.0–1.0)
ABS. NEUTROPHILS: 9.9 10*3/uL — ABNORMAL HIGH (ref 1.8–8.0)
ABSOLUTE NRBC: 0 10*3/uL (ref 0.00–0.01)
BASOPHILS: 0 % (ref 0–1)
EOSINOPHILS: 0 % (ref 0–7)
HCT: 38.6 % (ref 36.6–50.3)
HGB: 13 g/dL (ref 12.1–17.0)
IMMATURE GRANULOCYTES: 0 % (ref 0.0–0.5)
LYMPHOCYTES: 4 % — ABNORMAL LOW (ref 12–49)
MCH: 33.1 PG (ref 26.0–34.0)
MCHC: 33.7 g/dL (ref 30.0–36.5)
MCV: 98.2 FL (ref 80.0–99.0)
MONOCYTES: 4 % — ABNORMAL LOW (ref 5–13)
MPV: 10.9 FL (ref 8.9–12.9)
NEUTROPHILS: 92 % — ABNORMAL HIGH (ref 32–75)
NRBC: 0 PER 100 WBC
PLATELET: 164 10*3/uL (ref 150–400)
RBC: 3.93 M/uL — ABNORMAL LOW (ref 4.10–5.70)
RDW: 14.3 % (ref 11.5–14.5)
WBC: 10.7 10*3/uL (ref 4.1–11.1)

## 2017-08-09 LAB — HEPATIC FUNCTION PANEL
A-G Ratio: 1 — ABNORMAL LOW (ref 1.1–2.2)
ALT (SGPT): 75 U/L (ref 12–78)
AST (SGOT): 31 U/L (ref 15–37)
Albumin: 3.2 g/dL — ABNORMAL LOW (ref 3.5–5.0)
Alk. phosphatase: 120 U/L — ABNORMAL HIGH (ref 45–117)
Bilirubin, direct: 0.2 MG/DL (ref 0.0–0.2)
Bilirubin, total: 0.4 MG/DL (ref 0.2–1.0)
Globulin: 3.1 g/dL (ref 2.0–4.0)
Protein, total: 6.3 g/dL — ABNORMAL LOW (ref 6.4–8.2)

## 2017-08-09 LAB — CARBOXY HEMOGLOBIN
Carboxy-Hgb: 1.2 % (ref 1–2)
Methemoglobin: 0.2 % (ref 0–1.4)
O2 SATURATION: 97 % (ref 95–99)
Oxyhemoglobin: 95.4 % (ref 94–97)
tHb: 13.5 g/dL — ABNORMAL LOW (ref 14–17)

## 2017-08-09 LAB — DRUG SCREEN, URINE
AMPHETAMINES: NEGATIVE
BARBITURATES: NEGATIVE
BENZODIAZEPINES: POSITIVE — AB
COCAINE: NEGATIVE
METHADONE: NEGATIVE
OPIATES: NEGATIVE
PCP(PHENCYCLIDINE): NEGATIVE
THC (TH-CANNABINOL): POSITIVE — AB

## 2017-08-09 LAB — PHOSPHORUS: Phosphorus: 3.4 MG/DL (ref 2.6–4.7)

## 2017-08-09 LAB — MAGNESIUM: Magnesium: 3.1 mg/dL — ABNORMAL HIGH (ref 1.6–2.4)

## 2017-08-09 MED ORDER — HEPARIN (PORCINE) IN NS (PF) 1,000 UNIT/500 ML IV
1000 unit/500 mL | INTRAVENOUS | Status: DC | PRN
Start: 2017-08-09 — End: 2017-08-09
  Administered 2017-08-09 (×2)

## 2017-08-09 MED ORDER — FENTANYL CITRATE (PF) 50 MCG/ML IJ SOLN
50 mcg/mL | INTRAMUSCULAR | Status: DC | PRN
Start: 2017-08-09 — End: 2017-08-09
  Administered 2017-08-09: 19:00:00 via INTRAVENOUS

## 2017-08-09 MED ORDER — LIDOCAINE HCL 1 % (10 MG/ML) IJ SOLN
10 mg/mL (1 %) | INTRAMUSCULAR | Status: AC
Start: 2017-08-09 — End: ?

## 2017-08-09 MED ORDER — LIDOCAINE HCL 1 % (10 MG/ML) IJ SOLN
10 mg/mL (1 %) | INTRAMUSCULAR | Status: DC | PRN
Start: 2017-08-09 — End: 2017-08-09
  Administered 2017-08-09: 19:00:00 via INTRADERMAL

## 2017-08-09 MED ORDER — HEPARIN (PORCINE) IN NS (PF) 1,000 UNIT/500 ML IV
1000 unit/500 mL | INTRAVENOUS | Status: AC
Start: 2017-08-09 — End: ?

## 2017-08-09 MED ORDER — SODIUM CHLORIDE 0.9 % IJ SYRG
INTRAMUSCULAR | Status: DC | PRN
Start: 2017-08-09 — End: 2017-08-10

## 2017-08-09 MED ORDER — MIDAZOLAM 1 MG/ML IJ SOLN
1 mg/mL | INTRAMUSCULAR | Status: AC
Start: 2017-08-09 — End: ?

## 2017-08-09 MED ORDER — IOPAMIDOL 76 % IV SOLN
370 mg iodine /mL (76 %) | INTRAVENOUS | Status: DC | PRN
Start: 2017-08-09 — End: 2017-08-09
  Administered 2017-08-09: 20:00:00

## 2017-08-09 MED ORDER — FENTANYL CITRATE (PF) 50 MCG/ML IJ SOLN
50 mcg/mL | INTRAMUSCULAR | Status: AC
Start: 2017-08-09 — End: ?

## 2017-08-09 MED ORDER — MIDAZOLAM 1 MG/ML IJ SOLN
1 mg/mL | INTRAMUSCULAR | Status: DC | PRN
Start: 2017-08-09 — End: 2017-08-09
  Administered 2017-08-09: 20:00:00 via INTRAVENOUS

## 2017-08-09 MED ORDER — SODIUM CHLORIDE 0.9 % IJ SYRG
Freq: Three times a day (TID) | INTRAMUSCULAR | Status: DC
Start: 2017-08-09 — End: 2017-08-10
  Administered 2017-08-09 – 2017-08-10 (×3): via INTRAVENOUS

## 2017-08-09 MED ORDER — SODIUM CHLORIDE 0.9 % IV
INTRAVENOUS | Status: DC
Start: 2017-08-09 — End: 2017-08-09

## 2017-08-09 MED ORDER — IOPAMIDOL 76 % IV SOLN
370 mg iodine /mL (76 %) | INTRAVENOUS | Status: AC
Start: 2017-08-09 — End: ?

## 2017-08-09 MED FILL — LEVALBUTEROL 1.25 MG/3 ML SOLN FOR INHALATION: 1.25 mg/3 mL | RESPIRATORY_TRACT | Qty: 3

## 2017-08-09 MED FILL — HEPARIN (PORCINE) IN NS (PF) 1,000 UNIT/500 ML IV: 1000 unit/500 mL | INTRAVENOUS | Qty: 500

## 2017-08-09 MED FILL — ZOLPIDEM 5 MG TAB: 5 mg | ORAL | Qty: 1

## 2017-08-09 MED FILL — MUCUS RELIEF ER 600 MG TABLET, EXTENDED RELEASE: 600 mg | ORAL | Qty: 2

## 2017-08-09 MED FILL — IPRATROPIUM BROMIDE 0.02 % SOLN FOR INHALATION: 0.02 % | RESPIRATORY_TRACT | Qty: 2.5

## 2017-08-09 MED FILL — SOLU-MEDROL (PF) 40 MG/ML SOLUTION FOR INJECTION: 40 mg/mL | INTRAMUSCULAR | Qty: 1

## 2017-08-09 MED FILL — ENOXAPARIN 80 MG/0.8 ML SUB-Q SYRINGE: 80 mg/0.8 mL | SUBCUTANEOUS | Qty: 0.8

## 2017-08-09 MED FILL — SODIUM CHLORIDE 0.9 % IV: INTRAVENOUS | Qty: 1000

## 2017-08-09 MED FILL — MIDAZOLAM 1 MG/ML IJ SOLN: 1 mg/mL | INTRAMUSCULAR | Qty: 5

## 2017-08-09 MED FILL — FUROSEMIDE 10 MG/ML IJ SOLN: 10 mg/mL | INTRAMUSCULAR | Qty: 2

## 2017-08-09 MED FILL — FENTANYL CITRATE (PF) 50 MCG/ML IJ SOLN: 50 mcg/mL | INTRAMUSCULAR | Qty: 2

## 2017-08-09 MED FILL — BD POSIFLUSH NORMAL SALINE 0.9 % INJECTION SYRINGE: INTRAMUSCULAR | Qty: 40

## 2017-08-09 MED FILL — HYDROMORPHONE (PF) 2 MG/ML IJ SOLN: 2 mg/mL | INTRAMUSCULAR | Qty: 1

## 2017-08-09 MED FILL — LORATADINE 10 MG TAB: 10 mg | ORAL | Qty: 1

## 2017-08-09 MED FILL — ISOVUE-370  76 % INTRAVENOUS SOLUTION: 370 mg iodine /mL (76 %) | INTRAVENOUS | Qty: 200

## 2017-08-09 MED FILL — AMIODARONE 50 MG/ML IV SOLN: 50 mg/mL | INTRAVENOUS | Qty: 7.5

## 2017-08-09 MED FILL — FAMOTIDINE 20 MG TAB: 20 mg | ORAL | Qty: 1

## 2017-08-09 MED FILL — PROCHLORPERAZINE EDISYLATE 5 MG/ML INJECTION: 5 mg/mL | INTRAMUSCULAR | Qty: 2

## 2017-08-09 MED FILL — XYLOCAINE 10 MG/ML (1 %) INJECTION SOLUTION: 10 mg/mL (1 %) | INTRAMUSCULAR | Qty: 20

## 2017-08-09 MED FILL — PREDNISONE 20 MG TAB: 20 mg | ORAL | Qty: 3

## 2017-08-09 MED FILL — OXYCODONE-ACETAMINOPHEN 5 MG-325 MG TAB: 5-325 mg | ORAL | Qty: 1

## 2017-08-09 NOTE — Progress Notes (Addendum)
@  0720 Bedside and Verbal shift change report given to Hassell Halim RN (oncoming nurse) by Carlisle Cater RN (offgoing nurse). Report included the following information SBAR, Kardex, ED Summary, Procedure Summary, Intake/Output, MAR, Accordion, Recent Results, Med Rec Status, Cardiac Rhythm Sinus and Alarm Parameters .    Primary Nurse Dorothe Pea, RN and Carlisle Cater, RN performed a dual skin assessment on this patient No impairment noted  Braden score is 20      Dr. Lorella Nimrod arrives and exams the patient.  Plan of care is to obtain consent for cardiac cath and perhaps switch to oral amiodarone post cath.     Consent for cath obtain and Dr. Lorella Nimrod updated after speak with Selena Lesser ACNP for cardiology for blood consent.     Cath lab arrives and transport patient for procedure.     Patient returns to ICU 11 no signs of acute distress.  Mynx with CDI dressing.     Bedside and Verbal shift change report given to Metallurgist (oncoming nurse) by Hassell Halim RN (offgoing nurse). Report included the following information SBAR, Kardex, ED Summary, Procedure Summary, Intake/Output, MAR, Accordion, Recent Results, Med Rec Status, Cardiac Rhythm Sinus and Alarm Parameters .

## 2017-08-09 NOTE — Nurse Consult (Addendum)
Chart reviewed by Heart Failure Nurse Navigator.  Heart Failure database completed.     Patient admitted with atrial fib with RVR.  Echo showed reduced EF.  Patient for Right and Left heart catheterization today.   Plan written to add BB and ACE as hemodynamics allow.      EF:  16 to 20% with LA moderately dilated and moderate MR.    ACEi/ARB/ARNi: **    BB: **    Aldosterone Antagonist: **    Obstructive Sleep Apnea Screening:   STOP-BANG score:   Referred to Sleep Medicine:     CRT **.     NYHA Functional Class **.      Heart Failure Teach Back in Patient Education.    Heart Failure Avoiding Triggers on Discharge Instructions.      Cardiologist: Dr. Mort Sawyers    Post discharge follow up phone call to be made within 48-72 hours of discharge.      Met with patient at bedside.  Introduced self and role of HF NN.  Assessed patient current understanding of his heart disease.  Patient has good understanding of heart rhythm, cardioversion, and upcoming catheterization.  Patient notes for the past 2 weeks he has noted SOB and fluttering sensation in chest.  He had not seen a doctor for many years.  He works as Chartered certified accountant, backpack blowing, spreading mulch, etc., and had no symptoms until a few weeks ago.    Reviewed normal heart function, atrial fib disease process and basic definition systolic heart failure.  Reviewed symptoms of HF from HF magnet which patient identified with.  Discussed purpose of catheterization.  Briefly discussed HF self care measures of daily weight, lifestyle changes of toxic substances including nicotine and alcohol, and likely addition of medications to reduce progression of disease and improve symptoms.  Patient given Living with HF Education book, HF magnet, and HF calendar.  Patient confirmed his sister as next of kin to notify in emergency.  Patient access notified to update EMR. Will f/u with patient for additional education after catheterization.

## 2017-08-09 NOTE — Progress Notes (Signed)
Nutrition Assessment:    RECOMMENDATIONS/INTERVENTION(S):   1. Continue with Cardiac Diet.    2. Monitor PO intakes, weight, labs.      ASSESSMENT:   5/2: 57 yo male admitted for Afib.  RD assessment for ICU LOS.  No PMHx noted in chart.  Weight WNL per BMI per age.  Pt states his weight usually fluctuates between 150-170lbs.  NPO now for cardiac cath but previously has been on Cardiac Diet with good PO intakes.  Pt denies difficulty chewing/swallowing.  Reports good appetite at home.  Labs reviewed.  Meds: Lasix.      Diet Order: Cardiac  % Eaten:  No data found.  Pertinent Medications: '[x]'  Reviewed    Labs: '[x]'  Reviewed  Anthropometrics: Height: '5\' 8"'  (172.7 cm) Weight: 72.3 kg (159 lb 6.3 oz)    IBW (%IBW):   ( ) UBW (%UBW):   (  %)      BMI: Body mass index is 24.24 kg/m??.    This BMI is indicative of:   '[]'  Underweight    '[]'  Normal    '[]'  Overweight    '[]'   Obesity    '[]'   Extreme Obesity (BMI>40)  Estimated Nutrition Needs (Based on): 1973 Kcals/day(BMR 1518 X AF 1.3) , 58 g(58-72gm (0.8-1gm/kg/d)) Protein  Carbohydrate: At Least 130 g/day  Fluids: 1973 mL/day (1 ml/kcal)    Last BM: 4/30   '[x]' Active     '[]' Hyperactive  '[]' Hypoactive       '[]'  Absent   BS  Skin:    '[x]'  Intact   '[]'  Incision  '[]'  Breakdown   '[]'  DTI   '[]'  Tears/Excoriation/Abrasion  '[]' Edema '[]'  Other:   Wt Readings from Last 30 Encounters:   08/09/17 72.3 kg (159 lb 6.3 oz)      NUTRITION DIAGNOSES:   Problem:  No nutritional diagnosis at this time     Etiology: related to       Signs/Symptoms: as evidenced by        NUTRITION INTERVENTIONS:  Meals/Snacks: General/healthful diet                  GOAL:   Consume > 75% meals within next 4-6 days    Cultural, Religious, or Ethnic Dietary Needs: None     EDUCATION & DISCHARGE NEEDS:    '[x]'  None Identified   '[]'  Identified and Education Provided/Documented   '[]'  Identified and Pt declined/was not appropriate      '[x]'  Interdisciplinary Care Plan Reviewed/Documented     '[x]'  Discharge Needs:   Follow Cardiac Diet at home   '[]'  No Nutrition Related Discharge Needs    NUTRITION RISK:   Pt Is At Nutrition Risk  '[x]'      No Nutrition Risk Identified  '[]'        PT SEEN FOR:    '[]'   MD Consult: '[]' Calorie Count      '[]' Diabetic Diet Education        '[]' Diet Education     '[]' Electrolyte Management     '[]' General Nutrition Management and Supplements     '[]' Management of Tube Feeding     '[]' TPN Recommendations    '[]'   RN Referral:  '[]' MST score >=2     '[]' Enteral/Parenteral Nutrition PTA     '[]' Pregnant: Gestational DM or Multigestation                 '[]'  Pressure Ulcer    '[]'   Low BMI      '[x]'   ICU Length of  Stay       '[]'  Dysphagia Diet         '[]'  Ventilator  '[]'   Follow-up     Previous Recommendations:   '[]'  Implemented          '[]'  Not Implemented          '[]'  Not Applicable    Previous Goal:   '[]'  Met              '[]'  Progressing Towards Goal              '[x]'  Not Progressing Towards Goal   '[x]'  Not Applicable            Vella Kohler, [place]  Pager [phone]  Phone [phone]

## 2017-08-09 NOTE — Progress Notes (Addendum)
[facility]  [address] [place], [place] [address]  [phone]    Medical Progress Note      NAME: Steve Green   DOB:  [birth year]  MRM:  [id number]    Date/Time: 08/09/2017  12:46 PM       Subjective:     Chief Complaint: Pt seen. Upon arrival to the room pt on the phone. Continued to talk on the phone despite MD presence.        Objective:       Vitals:          Last 24hrs VS reviewed since prior progress note. Most recent are:    Visit Vitals  BP 110/61   Pulse 98   Temp 98.1 ??F (36.7 ??C)   Resp 24   Ht  (1.727 m)   Wt 72.3 kg (159 lb 6.3 oz)   SpO2 96%   BMI 24.24 kg/m??     SpO2 Readings from Last 6 Encounters:   08/09/17 96%    O2 Flow Rate (L/min): 2 l/min       Intake/Output Summary (Last 24 hours) at 08/09/2017 1439  Last data filed at 08/09/2017 1155  Gross per 24 hour   Intake 220 ml   Output 1800 ml   Net -1580 ml          Exam:     Physical Exam:    Gen:  Well-developed, well-nourished, in no acute distress  Pt talking on the phone. Did not hang up    Medications Reviewed: (see below)    Lab Data Reviewed: (see below)    ______________________________________________________________________    Medications:     Current Facility-Administered Medications   Medication Dose Route Frequency   ??? loratadine (CLARITIN) tablet 10 mg  10 mg Oral DAILY   ??? predniSONE (DELTASONE) tablet 60 mg  60 mg Oral DAILY WITH BREAKFAST   ??? guaiFENesin ER (MUCINEX) tablet 1,200 mg  1,200 mg Oral Q12H   ??? famotidine (PEPCID) tablet 20 mg  20 mg Oral BID   ??? furosemide (LASIX) injection 20 mg  20 mg IntraVENous DAILY   ??? amiodarone (CORDARONE) 375 mg in dextrose 5% 250 mL infusion  0.5-1 mg/min IntraVENous TITRATE   ??? enoxaparin (LOVENOX) injection 70 mg  1 mg/kg SubCUTAneous Q12H   ??? levalbuterol (XOPENEX) nebulizer soln 1.25 mg/3 mL  1.25 mg Nebulization Q4H RT   ??? ipratropium (ATROVENT) 0.02 % nebulizer solution 0.5 mg  0.5 mg Nebulization Q4H RT    ??? sodium chloride (NS) flush 5-40 mL  5-40 mL IntraVENous Q8H   ??? sodium chloride (NS) flush 5-40 mL  5-40 mL IntraVENous PRN   ??? sodium chloride (NS) flush 5-40 mL  5-40 mL IntraVENous Q8H   ??? sodium chloride (NS) flush 5-40 mL  5-40 mL IntraVENous PRN   ??? acetaminophen (TYLENOL) tablet 650 mg  650 mg Oral Q4H PRN   ??? oxyCODONE-acetaminophen (PERCOCET) 5-325 mg per tablet 1 Tab  1 Tab Oral Q4H PRN   ??? HYDROmorphone (PF) (DILAUDID) injection 0.5 mg  0.5 mg IntraVENous Q4H PRN   ??? prochlorperazine (COMPAZINE) injection 10 mg  10 mg IntraVENous Q6H PRN   ??? zolpidem (AMBIEN) tablet 5 mg  5 mg Oral QHS PRN            Lab Review:     Recent Labs     08/09/17  0345 08/08/17  0416 08/07/17  0309   WBC  10.7 4.1 6.3   HGB 13.0 12.8 13.7   HCT 38.6 37.7 40.0   PLT 164 156 175     Recent Labs     08/09/17  0345 08/08/17  0416 08/07/17  0309 08/06/17  2020   NA 137 139 140 142   K 4.2 4.1 4.2 4.2   CL 107 111* 113* 114*   CO2 23 15* 20* 21   GLU 138* 157* 105* 102*   BUN CREA 0.97 0.91 0.91 1.06   CA 8.7 8.1* 8.6 8.8   MG 3.1* 1.8 1.7 1.7   PHOS 3.4  --  3.4  --    ALB 3.2*  --   --  3.3*   SGOT 31  --   --  52*   ALT 75  --   --  116*     No components found for: [facility]         Assessment / Plan:   A flutter with RVR (HCC) (08/06/2017) - now s/p cardioversion and in NSR  -amio post cath   -eliquis considering aflutter and systolic dysfunction      Chest pain (08/06/2017) - ?2/2 a-fib. Resolved   -L and R heart cath pending this afternoon       Hyperglycemia (08/06/2017) - A1c 5.9   -monitor       Elevated troponin (08/06/2017) - likely type 2 MI from demand from a-fib   -currently stable   -as above; ischemic work-up as per cardiology as above     Acute systolic CHF - ?2/2 a-fib v. Underlying ischemia   -R and L heart cath this afternoon  -defer need for ACE I and beta blocker to cardiology. Suspect BP may not tolerate   -continue diuresis   -strict I's and O's   -daily weights      COPD - not formally diagnosed but likely considering tobacco use   -continue nebs and steroids; weaning steroids   -counseled on cessation     Total time spent with patient: 71 Minutes                  Care Plan discussed with: Patient, Family and Nursing Staff, Pulmonary and cardiology     Discussed:  Code Status, Care Plan and D/C Planning    Prophylaxis:  Lovenox    Disposition:  Home w/Family           ___________________________________________________    Attending Physician: Gaspar Bidding, MD

## 2017-08-09 NOTE — Progress Notes (Signed)
Follow-up note:    1.Plan is for right'/eft heart catherization today @ 1500 pm.    2.Noted that entresto is being considered . CHF nurse navigator will assist pt with entresto application if necessary.    3.Also,noted in cardiology's note is the plan to start pt on eliquis after his catherization today,    4.Pt.'s preferred pharmacy is [facility] on  [address]     5.As pt is not homebound,he will benefit from a hospital to home CHF visit.I will discuss this with cardiology NP.    6.I discussed pt.'s substance abuse history with him.He told me he used cocaine in his 57s but not since then.How the heart is impacted by substance abuse was discussed with pt.    7.Pt is studying his cardiac education booklet.    8.Sister is Okey Regal.Her number is [phone].    9.If cardiology will please send eliquis script to [facility] on [address] and the [employer] script (if this is prescribed) ,I will check to see much pt.'s co-pay will be.    10.I will follow-up with pt tomorrow for discharge needs.    Beth Aristov,RN

## 2017-08-09 NOTE — Progress Notes (Addendum)
1910 - Bedside and Verbal shift change report given to Jodelle Gross (oncoming nurse) by Thayer Ohm, RN (offgoing nurse). Report included the following information SBAR, Kardex, ED Summary, Procedure Summary, MAR, Recent Results and Cardiac Rhythm NSR with PVCs.  2000 - Shift assessment completed. See flow sheet. Patient alert and oriented x4 on room air.   0000 - Reassessment completed. No changes to previous assessment. Patient in bed resting quietly.  0200 - In bed sleeping.  0400 - Reassessment completed. No change from previous assessments. Patient alert and oriented x4 on room air. No bleeding at cath site. No complaints of pain.  0600 - Patient resting quietly in bed.  0722 - Bedside and Verbal shift change report given to Autumn, RN (Cabin crew) by Jodelle Gross (offgoing nurse). Report included the following information SBAR, Kardex, ED Summary, Procedure Summary, MAR, Recent Results and Cardiac Rhythm NSR with PVCs.

## 2017-08-09 NOTE — Progress Notes (Addendum)
Progress Note                                        Mark A. Doloresco, MD, [facility]                                                                [address], [place], [place] [address]                                                 Phone [phone]; Fax [phone]        08/09/2017 9:57 AM     Admit Date:           08/06/2017  Admit Diagnosis:  Atrial fibrillation with RVR (HCC) [I48.91]  DOB:          31-May-1960   MRN:          [id number]   ASSESSMENT/RECOMMENDATION:         1)SOB  -etiology unclear but may be related to RVR.  -EF severely reduced  - for R/L HC 3 pm. D/W pt verbal consent obtained   - likely change loops to po for tomorrow  - add BB, ACE , consider entresto as hemodynamics allow.   - change amio to po post cath  - ?life vest   ??  2) atrial flutter  -remains in SR post DCCV  -will continue lovenox and begin eliquis after cath    3. Elevated troponin : essentially flat. No MI        ??     ??  ??  ??    No intake/output data recorded.    Last 3 Recorded Weights in this Encounter    08/07/17 1414 08/08/17 0050 08/09/17 0519   Weight: 165 lb (74.8 kg) 161 lb 2.5 oz (73.1 kg) 159 lb 6.3 oz (72.3 kg)         04/30 1901 - 05/02 0700  In: 380 [I.V.:380]  Out: 3050 [Urine:3050]    SUBJECTIVE           Mr.??Sweeden??is a 57 y.o.????African American??male??who is admitted with??Afib with RVR.????Mr.??Cicero??presented to the Emergency Department this PM??complaining of SOB: for the past week, intermittent, worse with exertion, associated with cough. He states his sx's started a week ago. He has noted orthopnea and PND. Associated sx's include chest heaviness. He has stated he ses no LEE but some gas and abdominal bloating. 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 This has been over the last three days. He has had cold sx's and has taken  Mucinex Dm and robitussin. He does not stay hydrated and will  drink one beer every night.Marland Kitchen +FMHx, + tobacco, Unknown cholesterol.     [name] reports breathing better      Current Facility-Administered Medications   Medication Dose Route Frequency   ??? loratadine (CLARITIN) tablet 10 mg  10 mg Oral DAILY   ??? predniSONE (DELTASONE) tablet 60 mg  60 mg Oral  DAILY WITH BREAKFAST   ??? guaiFENesin ER (MUCINEX) tablet 1,200 mg  1,200 mg Oral Q12H   ??? famotidine (PEPCID) tablet 20 mg  20 mg Oral BID   ??? furosemide (LASIX) injection 20 mg  20 mg IntraVENous DAILY   ??? amiodarone (CORDARONE) 375 mg in dextrose 5% 250 mL infusion  0.5-1 mg/min IntraVENous TITRATE   ??? enoxaparin (LOVENOX) injection 70 mg  1 mg/kg SubCUTAneous Q12H   ??? levalbuterol (XOPENEX) nebulizer soln 1.25 mg/3 mL  1.25 mg Nebulization Q4H RT   ??? ipratropium (ATROVENT) 0.02 % nebulizer solution 0.5 mg  0.5 mg Nebulization Q4H RT   ??? sodium chloride (NS) flush 5-40 mL  5-40 mL IntraVENous Q8H   ??? sodium chloride (NS) flush 5-40 mL  5-40 mL IntraVENous PRN   ??? sodium chloride (NS) flush 5-40 mL  5-40 mL IntraVENous Q8H   ??? sodium chloride (NS) flush 5-40 mL  5-40 mL IntraVENous PRN   ??? acetaminophen (TYLENOL) tablet 650 mg  650 mg Oral Q4H PRN   ??? oxyCODONE-acetaminophen (PERCOCET) 5-325 mg per tablet 1 Tab  1 Tab Oral Q4H PRN   ??? HYDROmorphone (PF) (DILAUDID) injection 0.5 mg  0.5 mg IntraVENous Q4H PRN   ??? prochlorperazine (COMPAZINE) injection 10 mg  10 mg IntraVENous Q6H PRN   ??? zolpidem (AMBIEN) tablet 5 mg  5 mg Oral QHS PRN      OBJECTIVE             Intake/Output Summary (Last 24 hours) at 08/09/2017 0731  Last data filed at 08/09/2017 0600  Gross per 24 hour   Intake 300 ml   Output 2300 ml   Net -2000 ml       Review of Systems - History obtained from the patient AS PER  HPI        PHYSICAL EXAM        Visit Vitals  BP 109/81   Pulse 90   Temp 98.1 ??F (36.7 ??C)   Resp 17   Ht  (1.727 m)   Wt 159 lb 6.3 oz (72.3 kg)   SpO2 94%   BMI 24.24 kg/m??        Gen: Well-developed, well-nourished, in no acute distress  alert and oriented x 3  HEENT:  Pink conjunctivae, Hearing grossly normal.   Oral mucosa moist   Neck: Supple, No JVD, No Carotid Bruits,  Resp: No accessory muscle use, Clear breath sounds, No rales or rhonchi  Card: Rapid and irregular  MSK: No cyanosis or clubbing, good capillary refill  Skin: No rashes or ulcers, no bruising  Neuro:  Cranial nerves are grossly intact, moving all four extremities, no focal deficit, follows commands appropriately  Psych:  fair insight, oriented to person, place and time, alert, Nml Affect  LE: No edema       DATA REVIEW            Laboratory and Imaging have been reviewed by me and are notable for  Recent Labs     08/08/17  0417 08/07/17  1123 08/07/17  0309   TROIQ 0.07* 0.09* 0.08*     Recent Labs     08/09/17  0345 08/08/17  0416 08/07/17  0309   NA 137 139 140   K 4.2 4.1 4.2   CO2 23 15* 20*   BUN CREA 0.97 0.91 0.91   GLU 138* 157* 105*   PHOS 3.4  --  3.4  MG 3.1* 1.8 1.7   WBC 10.7 4.1 6.3   HGB 13.0 12.8 13.7   HCT 38.6 37.7 40.0   PLT 164 156 175             Vincent Peyer, NP

## 2017-08-09 NOTE — Progress Notes (Signed)
[facility] Critical Care, and Sleep Medicine  Name: Steve Green MRN: [id number]   DOB: Nov 19, 1960 Hospital: [facility]   Date: 08/09/2017        Critical Care Initial Patient Consult    IMPRESSION:   ?? afib w/ RVR - s/p DCCV 5/1. Currently in SR  ?? ?COPD +/- acute exacerbation  ?? Cardiomyopathy (EF 21-25%), moderate MR  ?? Tobacco/etoh abuse      RECOMMENDATIONS:   ?? Supplemental O2 as needed to keep sats > 90%  ?? Continue xopenex/atrovent nebs, claritin, mucinex and flutter valve  ?? Prednisone taper  ?? on pepcid for GERD  ?? RVP pending  ?? For LHC today  ?? Continue lasix  ?? Needs PFTs and sleep study as an outpatient    DVT ppx: on therapeutic dose lovenox  GI ppx: pepcid    awaiting transfer out of the ICU     Subjective/History:    Steve Green is a 57yo male w/ no pmh who presented to the ER on 4/30 w/ complaints of shortness of breath, wheezing, chest pressure and cough productive of yellow sputum. Also, found to be in afib w/ RVR.      No prior history of asthma, COPD or OSA.  +seasonal allergies (summer worst) and GERD  Denies post nasal drip   Has symptoms suggestive of sleep apnea    FHx: negative for inheritable lung disease  SHx: smokes 1/2 ppd since age 25. Drinks a 24oz beer daily. Denies illicits. Works as a Development worker, [employer]    4/29 - echo: EF 21-25%, RV normal, mod MR, PASP 43  -------------------------------------------------  24hr events:  Says his breathing is better  Still w/ a few wheezes  Denies chest pain      Prior to Admission medications    Medication Sig Start Date End Date Taking? Authorizing Provider   guaiFENesin ER (MUCINEX) 600 mg ER tablet Take 600 mg by mouth two (2) times daily as needed for Congestion.   Yes Provider, Historical   acetaminophen (TYLENOL) 325 mg tablet Take 325 mg by mouth every six (6) hours as needed for Pain.   Yes Provider, Historical     Current Facility-Administered Medications   Medication Dose Route Frequency    ??? loratadine (CLARITIN) tablet 10 mg  10 mg Oral DAILY   ??? predniSONE (DELTASONE) tablet 60 mg  60 mg Oral DAILY WITH BREAKFAST   ??? guaiFENesin ER (MUCINEX) tablet 1,200 mg  1,200 mg Oral Q12H   ??? famotidine (PEPCID) tablet 20 mg  20 mg Oral BID   ??? furosemide (LASIX) injection 20 mg  20 mg IntraVENous DAILY   ??? amiodarone (CORDARONE) 375 mg in dextrose 5% 250 mL infusion  0.5-1 mg/min IntraVENous TITRATE   ??? enoxaparin (LOVENOX) injection 70 mg  1 mg/kg SubCUTAneous Q12H   ??? levalbuterol (XOPENEX) nebulizer soln 1.25 mg/3 mL  1.25 mg Nebulization Q4H RT   ??? ipratropium (ATROVENT) 0.02 % nebulizer solution 0.5 mg  0.5 mg Nebulization Q4H RT   ??? sodium chloride (NS) flush 5-40 mL  5-40 mL IntraVENous Q8H   ??? sodium chloride (NS) flush 5-40 mL  5-40 mL IntraVENous Q8H     No Known Allergies   Social History     Tobacco Use   ??? Smoking status: Current Every Day Smoker     Packs/day: 0.50   Substance Use Topics   ??? Alcohol use: Yes      Family History  Problem Relation Age of Onset   ??? Heart Disease Other           Objective:  Vital Signs:    Visit Vitals  BP 110/61   Pulse 98   Temp 98.1 ??F (36.7 ??C)   Resp 24   Ht '5\' 8"'  (1.727 m)   Wt 72.3 kg (159 lb 6.3 oz)   SpO2 96%   BMI 24.24 kg/m??       O2 Device: Nasal cannula   O2 Flow Rate (L/min): 2 l/min   Temp (24hrs), Avg:98.2 ??F (36.8 ??C), Min:98 ??F (36.7 ??C), Max:98.3 ??F (36.8 ??C)     Intake/Output:   Last shift:      05/02 0701 - 05/02 1900  In: -   Out: 850 [Urine:850]  Last 3 shifts: 04/30 1901 - 05/02 0700  In: 380 [I.V.:380]  Out: 3050 [Urine:3050]    Intake/Output Summary (Last 24 hours) at 08/09/2017 1317  Last data filed at 08/09/2017 1155  Gross per 24 hour   Intake 220 ml   Output 2400 ml   Net -2180 ml     Hemodynamics:   PAP:   CO:     Wedge:   CI:     CVP:    SVR:       PVR:       Ventilator Settings:  Mode Rate Tidal Volume Pressure FiO2 PEEP                    Peak airway pressure:      Minute ventilation:        Physical Exam:     General:  Alert, cooperative, no distress, appears stated age.   Head:     Eyes:     Nose:    Throat:    Neck:    Back:      Lungs:   Few wheezes and rhonchi   Chest wall:     Heart:  RRR   Abdomen:   Soft, non-tender. Bowel sounds normal   Extremities: Extremities normal, atraumatic, no cyanosis or edema.   Pulses:    Skin: No rashes or lesions   Lymph nodes:    Neurologic: Oriented x 3       Data:     Recent Results (from the past 24 hour(s))   DRUG SCREEN, URINE    Collection Time: 08/08/17 11:23 PM   Result Value Ref Range    AMPHETAMINES NEGATIVE  NEG      BARBITURATES NEGATIVE  NEG      BENZODIAZEPINES POSITIVE (A) NEG      COCAINE NEGATIVE  NEG      METHADONE NEGATIVE  NEG      OPIATES NEGATIVE  NEG      PCP(PHENCYCLIDINE) NEGATIVE  NEG      THC (TH-CANNABINOL) POSITIVE (A) NEG      Drug screen comment (NOTE)    CBC WITH AUTOMATED DIFF    Collection Time: 08/09/17  3:45 AM   Result Value Ref Range    WBC 10.7 4.1 - 11.1 K/uL    RBC 3.93 (L) 4.10 - 5.70 M/uL    HGB 13.0 12.1 - 17.0 g/dL    HCT 38.6 36.6 - 50.3 %    MCV 98.2 80.0 - 99.0 FL    MCH 33.1 26.0 - 34.0 PG    MCHC 33.7 30.0 - 36.5 g/dL    RDW 14.3 11.5 - 14.5 %    PLATELET 164 150 -  400 K/uL    MPV 10.9 8.9 - 12.9 FL    NRBC 0.0 0 PER 100 WBC    ABSOLUTE NRBC 0.00 0.00 - 0.01 K/uL    NEUTROPHILS 92 (H) 32 - 75 %    LYMPHOCYTES 4 (L) 12 - 49 %    MONOCYTES 4 (L) 5 - 13 %    EOSINOPHILS 0 0 - 7 %    BASOPHILS 0 0 - 1 %    IMMATURE GRANULOCYTES 0 0.0 - 0.5 %    ABS. NEUTROPHILS 9.9 (H) 1.8 - 8.0 K/UL    ABS. LYMPHOCYTES 0.4 (L) 0.8 - 3.5 K/UL    ABS. MONOCYTES 0.4 0.0 - 1.0 K/UL    ABS. EOSINOPHILS 0.0 0.0 - 0.4 K/UL    ABS. BASOPHILS 0.0 0.0 - 0.1 K/UL    ABS. IMM. GRANS. 0.0 0.00 - 0.04 K/UL    DF SMEAR SCANNED      RBC COMMENTS NORMOCYTIC, NORMOCHROMIC     METABOLIC PANEL, BASIC    Collection Time: 08/09/17  3:45 AM   Result Value Ref Range    Sodium 137 136 - 145 mmol/L    Potassium 4.2 3.5 - 5.1 mmol/L    Chloride 107 97 - 108 mmol/L     CO2 23 21 - 32 mmol/L    Anion gap 7 5 - 15 mmol/L    Glucose 138 (H) 65 - 100 mg/dL    BUN 16 6 - 20 MG/DL    Creatinine 0.97 0.70 - 1.30 MG/DL    BUN/Creatinine ratio 16 12 - 20      GFR est AA >60 >60 ml/min/1.45m    GFR est non-AA >60 >60 ml/min/1.724m   Calcium 8.7 8.5 - 10.1 MG/DL   MAGNESIUM    Collection Time: 08/09/17  3:45 AM   Result Value Ref Range    Magnesium 3.1 (H) 1.6 - 2.4 mg/dL   HEPATIC FUNCTION PANEL    Collection Time: 08/09/17  3:45 AM   Result Value Ref Range    Protein, total 6.3 (L) 6.4 - 8.2 g/dL    Albumin 3.2 (L) 3.5 - 5.0 g/dL    Globulin 3.1 2.0 - 4.0 g/dL    A-G Ratio 1.0 (L) 1.1 - 2.2      Bilirubin, total 0.4 0.2 - 1.0 MG/DL    Bilirubin, direct 0.2 0.0 - 0.2 MG/DL    Alk. phosphatase 120 (H) 45 - 117 U/L    AST (SGOT) 31 15 - 37 U/L    ALT (SGPT) 75 12 - 78 U/L   PHOSPHORUS    Collection Time: 08/09/17  3:45 AM   Result Value Ref Range    Phosphorus 3.4 2.6 - 4.7 MG/DL                 Imaging:  I have personally reviewed the patient???s radiographs and have reviewed the reports:            CaCarlene CoriaMD

## 2017-08-10 LAB — METABOLIC PANEL, BASIC
Anion gap: 5 mmol/L (ref 5–15)
BUN/Creatinine ratio: 16 (ref 12–20)
BUN: 16 MG/DL (ref 6–20)
CO2: 26 mmol/L (ref 21–32)
Calcium: 8.5 MG/DL (ref 8.5–10.1)
Chloride: 106 mmol/L (ref 97–108)
Creatinine: 1.01 MG/DL (ref 0.70–1.30)
GFR est AA: >60 mL/min/{1.73_m2} (ref >60)
GFR est non-AA: >60 mL/min/{1.73_m2} (ref >60)
Glucose: 101 mg/dL — ABNORMAL HIGH (ref 65–100)
Potassium: 3.8 mmol/L (ref 3.5–5.1)
Sodium: 137 mmol/L (ref 136–145)

## 2017-08-10 LAB — NT-PRO BNP: NT pro-BNP: 5832 PG/ML — ABNORMAL HIGH (ref <125)

## 2017-08-10 LAB — CARBOXY HEMOGLOBIN
Carboxy-Hgb: 1.1 % (ref 1–2)
Methemoglobin: 0.4 % (ref 0–1.4)
O2 SATURATION: 73 % — ABNORMAL LOW (ref 95–99)
Oxyhemoglobin: 71.9 % — CL (ref 94–97)
tHb: 13.3 g/dL — ABNORMAL LOW (ref 14–17)

## 2017-08-10 LAB — MAGNESIUM: Magnesium: 2.1 mg/dL (ref 1.6–2.4)

## 2017-08-10 MED ORDER — APIXABAN 5 MG TABLET
5 mg | ORAL_TABLET | Freq: Two times a day (BID) | ORAL | 0 refills | Status: DC
Start: 2017-08-10 — End: 2017-08-10

## 2017-08-10 MED ORDER — LISINOPRIL 2.5 MG TAB
2.5 mg | ORAL_TABLET | Freq: Every evening | ORAL | 0 refills | Status: DC
Start: 2017-08-10 — End: 2017-08-10

## 2017-08-10 MED ORDER — LISINOPRIL 2.5 MG TAB
2.5 mg | ORAL_TABLET | Freq: Every evening | ORAL | 0 refills | Status: DC
Start: 2017-08-10 — End: 2017-10-01

## 2017-08-10 MED ORDER — ASPIRIN 81 MG CHEWABLE TAB
81 mg | Freq: Every day | ORAL | Status: DC
Start: 2017-08-10 — End: 2017-08-10
  Administered 2017-08-10: 13:00:00 via ORAL

## 2017-08-10 MED ORDER — POTASSIUM CHLORIDE SR 10 MEQ TAB
10 mEq | ORAL | Status: AC
Start: 2017-08-10 — End: 2017-08-10
  Administered 2017-08-10: 13:00:00 via ORAL

## 2017-08-10 MED ORDER — LISINOPRIL 5 MG TAB
5 mg | Freq: Every evening | ORAL | Status: DC
Start: 2017-08-10 — End: 2017-08-10

## 2017-08-10 MED ORDER — METOPROLOL SUCCINATE SR 25 MG 24 HR TAB
25 mg | Freq: Every day | ORAL | Status: DC
Start: 2017-08-10 — End: 2017-08-10
  Administered 2017-08-10: 13:00:00 via ORAL

## 2017-08-10 MED ORDER — METOPROLOL SUCCINATE SR 25 MG 24 HR TAB
25 mg | ORAL_TABLET | Freq: Every day | ORAL | 0 refills | Status: DC
Start: 2017-08-10 — End: 2017-08-10

## 2017-08-10 MED ORDER — APIXABAN 5 MG TABLET
5 mg | ORAL_TABLET | Freq: Two times a day (BID) | ORAL | 0 refills | Status: DC
Start: 2017-08-10 — End: 2018-01-16

## 2017-08-10 MED ORDER — PREDNISONE 20 MG TAB
20 mg | ORAL_TABLET | ORAL | 0 refills | Status: DC
Start: 2017-08-10 — End: 2017-08-29

## 2017-08-10 MED ORDER — AMIODARONE 200 MG TAB
200 mg | ORAL_TABLET | Freq: Every day | ORAL | 0 refills | Status: DC
Start: 2017-08-10 — End: 2017-08-10

## 2017-08-10 MED ORDER — AMIODARONE 200 MG TAB
200 mg | ORAL_TABLET | Freq: Every day | ORAL | 0 refills | Status: DC
Start: 2017-08-10 — End: 2017-11-16

## 2017-08-10 MED ORDER — ASPIRIN 81 MG CHEWABLE TAB
81 mg | ORAL_TABLET | Freq: Every day | ORAL | 0 refills | Status: DC
Start: 2017-08-10 — End: 2018-03-04

## 2017-08-10 MED ORDER — PREDNISONE 20 MG TAB
20 mg | ORAL_TABLET | ORAL | 0 refills | Status: DC
Start: 2017-08-10 — End: 2017-08-10

## 2017-08-10 MED ORDER — AMIODARONE 200 MG TAB
200 mg | Freq: Every day | ORAL | Status: DC
Start: 2017-08-10 — End: 2017-08-10
  Administered 2017-08-10: 13:00:00 via ORAL

## 2017-08-10 MED ORDER — METOPROLOL SUCCINATE SR 25 MG 24 HR TAB
25 mg | ORAL_TABLET | Freq: Every day | ORAL | 0 refills | Status: DC
Start: 2017-08-10 — End: 2017-12-05

## 2017-08-10 MED ORDER — APIXABAN 5 MG TABLET
5 mg | Freq: Two times a day (BID) | ORAL | Status: DC
Start: 2017-08-10 — End: 2017-08-10
  Administered 2017-08-10: 13:00:00 via ORAL

## 2017-08-10 MED FILL — AMIODARONE 200 MG TAB: 200 mg | ORAL | Qty: 1

## 2017-08-10 MED FILL — IPRATROPIUM BROMIDE 0.02 % SOLN FOR INHALATION: 0.02 % | RESPIRATORY_TRACT | Qty: 2.5

## 2017-08-10 MED FILL — MUCUS RELIEF ER 600 MG TABLET, EXTENDED RELEASE: 600 mg | ORAL | Qty: 2

## 2017-08-10 MED FILL — ASPIRIN 81 MG CHEWABLE TAB: 81 mg | ORAL | Qty: 1

## 2017-08-10 MED FILL — FAMOTIDINE 20 MG TAB: 20 mg | ORAL | Qty: 1

## 2017-08-10 MED FILL — METOPROLOL SUCCINATE SR 25 MG 24 HR TAB: 25 mg | ORAL | Qty: 1

## 2017-08-10 MED FILL — LORATADINE 10 MG TAB: 10 mg | ORAL | Qty: 1

## 2017-08-10 MED FILL — ELIQUIS 5 MG TABLET: 5 mg | ORAL | Qty: 1

## 2017-08-10 MED FILL — POTASSIUM CHLORIDE SR 10 MEQ TAB: 10 mEq | ORAL | Qty: 2

## 2017-08-10 MED FILL — LEVALBUTEROL 1.25 MG/3 ML SOLN FOR INHALATION: 1.25 mg/3 mL | RESPIRATORY_TRACT | Qty: 3

## 2017-08-10 MED FILL — AMIODARONE 50 MG/ML IV SOLN: 50 mg/mL | INTRAVENOUS | Qty: 7.5

## 2017-08-10 MED FILL — LOVENOX 80 MG/0.8 ML SUBCUTANEOUS SYRINGE: 80 mg/0.8 mL | SUBCUTANEOUS | Qty: 0.8

## 2017-08-10 MED FILL — PREDNISONE 20 MG TAB: 20 mg | ORAL | Qty: 3

## 2017-08-10 NOTE — Other (Signed)
The following appointments have been successfully scheduled:    Date/time Thursday, September 13, 2017 01:00 PM  Patient  Steve Green, Steve Green Dec 18, 1960 [phone] [place]) #[id number] J#[id number]  Department [facility] OFF[facility]  Appointment type New Patient  Provider Kaleen Odea

## 2017-08-10 NOTE — Progress Notes (Signed)
Pharmacist Discharge Medication Reconciliation    Discharge Provider:  Selena Lesser, NP  Discussed with cardiology- Patient to remain on eliquis for 4 weeks if he remains in NSR. May need extended therapy if he develops afib/aflutter.       Discharge Medications:      My Medications        START taking these medications        Instructions Each Dose to Equal Morning Noon Evening Bedtime   amiodarone 200 mg tablet  Commonly known as:  CORDARONE    Your last dose was:      Your next dose is:          Take 1 Tab by mouth daily.   200 mg                 apixaban 5 mg tablet  Commonly known as:  ELIQUIS    Your last dose was:      Your next dose is:          Take 1 Tab by mouth two (2) times a day.   5 mg                 aspirin 81 mg chewable tablet  Start taking on:  08/11/2017    Your last dose was:      Your next dose is:          Take 1 Tab by mouth daily.   81 mg                 lisinopril 2.5 mg tablet  Commonly known as:  PRINIVIL, ZESTRIL    Your last dose was:      Your next dose is:          Take 1 Tab by mouth nightly.   2.5 mg                 metoprolol succinate 25 mg XL tablet  Commonly known as:  TOPROL-XL    Your last dose was:      Your next dose is:          Take 1 Tab by mouth daily.   25 mg                 predniSONE 20 mg tablet  Commonly known as:  DELTASONE    Your last dose was:      Your next dose is:          Take  x 2 days then  x 2 days then stop                         STOP taking these medications      acetaminophen 325 mg tablet  Commonly known as:  TYLENOL        MUCINEX 600 mg ER tablet  Generic drug:  guaiFENesin ER                  Where to Get Your Medications        These medications were sent to [facility] [address] - [place], [place] - [phone] AT [facility] & [address]  [phone], [place] [address]      Phone:  [phone]   apixaban 5 mg tablet       Information on where to get these meds will be given to you by the nurse  or  doctor.    Ask your nurse or doctor about these medications  amiodarone 200 mg tablet  aspirin 81 mg chewable tablet  lisinopril 2.5 mg tablet  metoprolol succinate 25 mg XL tablet  predniSONE 20 mg tablet        The patient's chart, MAR, and AVS were reviewed by   Suszanne Finch, PHARMD,   Contact: [phone]

## 2017-08-10 NOTE — Progress Notes (Addendum)
Progress Note                                        Steve Cerro A. Aeralyn Barna, MD, [facility]                                                                [address], [place], [place] [address]                                                 Phone [phone]; Fax [phone]        08/10/2017 9:57 AM     Admit Date:           08/06/2017  Admit Diagnosis:  Atrial fibrillation with RVR (HCC) [I48.91]  DOB:          January 22, 1961   MRN:          [id number]   ASSESSMENT/RECOMMENDATION:      ATTENDING CARDIOLOGIST  Reviewed with patient improtance of follow up and will adjust meds accordingly.  PATIENT was  Personally examined and chart reviewed. All the elements of history and examination were personally performed and I agree with the plan as listed above.  Treatment plan was addressed with the patient.    Steve Chestnut, MD          1. NICM:  -EF severely reduced  - no CAD by cath  - start toprol XL, low dose ACE-I  - 6 MWT  - fit for life vest, reviewed with pt  - ck pBNP  -prn lasix for home  ??  2) atrial flutter  -remains in SR post DCCV  Change amio from IV to po  -stop lovenox, start Eliquis 5 mg BID    3. Elevated troponin : essentially flat. No MI      Home today once life vest arranged, 6 MWT completed.   Ok to tele  ??     ??  ??  ??    No intake/output data recorded.    Last 3 Recorded Weights in this Encounter    08/07/17 1414 08/08/17 0050 08/09/17 0519   Weight: 165 lb (74.8 kg) 161 lb 2.5 oz (73.1 kg) 159 lb 6.3 oz (72.3 kg)         05/01 1901 - 05/03 0700  In: 739.4 [I.V.:739.4]  Out: 2425 [Urine:2425]    SUBJECTIVE           Mr.??Green??is a 57 y.o.????African American??male??who is admitted with??Afib with RVR.????Mr.??Green??presented to the Emergency Department this PM??complaining of SOB: for the past week, intermittent, worse with exertion, associated with cough. He states his sx's started a week ago. He  has noted orthopnea and PND. Associated sx's include chest heaviness. He has stated he ses no LEE but some gas and abdominal bloating. 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 This has been over the last three days. He has had cold sx's and has taken  Mucinex Dm and robitussin. He does not  stay hydrated and will drink one beer every night.Marland Kitchen +FMHx, + tobacco,     Steve Green reports breathing better, feels ok      Current Facility-Administered Medications   Medication Dose Route Frequency   ??? apixaban (ELIQUIS) tablet 5 mg  5 mg Oral BID   ??? metoprolol succinate (TOPROL-XL) XL tablet 25 mg  25 mg Oral DAILY   ??? lisinopril (PRINIVIL, ZESTRIL) tablet 2.5 mg  2.5 mg Oral QHS   ??? amiodarone (CORDARONE) tablet 200 mg  200 mg Oral DAILY   ??? potassium chloride SR (KLOR-CON 10) tablet 20 mEq  20 mEq Oral NOW   ??? sodium chloride (NS) flush 5-40 mL  5-40 mL IntraVENous Q8H   ??? sodium chloride (NS) flush 5-40 mL  5-40 mL IntraVENous PRN   ??? loratadine (CLARITIN) tablet 10 mg  10 mg Oral DAILY   ??? predniSONE (DELTASONE) tablet 60 mg  60 mg Oral DAILY WITH BREAKFAST   ??? guaiFENesin ER (MUCINEX) tablet 1,200 mg  1,200 mg Oral Q12H   ??? famotidine (PEPCID) tablet 20 mg  20 mg Oral BID   ??? levalbuterol (XOPENEX) nebulizer soln 1.25 mg/3 mL  1.25 mg Nebulization Q4H RT   ??? ipratropium (ATROVENT) 0.02 % nebulizer solution 0.5 mg  0.5 mg Nebulization Q4H RT   ??? sodium chloride (NS) flush 5-40 mL  5-40 mL IntraVENous Q8H   ??? sodium chloride (NS) flush 5-40 mL  5-40 mL IntraVENous PRN   ??? sodium chloride (NS) flush 5-40 mL  5-40 mL IntraVENous Q8H   ??? sodium chloride (NS) flush 5-40 mL  5-40 mL IntraVENous PRN   ??? acetaminophen (TYLENOL) tablet 650 mg  650 mg Oral Q4H PRN   ??? oxyCODONE-acetaminophen (PERCOCET) 5-325 mg per tablet 1 Tab  1 Tab Oral Q4H PRN   ??? HYDROmorphone (PF) (DILAUDID) injection 0.5 mg  0.5 mg IntraVENous Q4H PRN    ??? prochlorperazine (COMPAZINE) injection 10 mg  10 mg IntraVENous Q6H PRN   ??? zolpidem (AMBIEN) tablet 5 mg  5 mg Oral QHS PRN      OBJECTIVE             Intake/Output Summary (Last 24 hours) at 08/10/2017 0721  Last data filed at 08/10/2017 0700  Gross per 24 hour   Intake 519.38 ml   Output 1475 ml   Net -955.62 ml       Review of Systems - History obtained from the patient AS PER  HPI        PHYSICAL EXAM        Visit Vitals  BP 111/75   Pulse 90   Temp 98.1 ??F (36.7 ??C)   Resp 26   Ht  (1.727 m)   Wt 159 lb 6.3 oz (72.3 kg)   SpO2 98%   BMI 24.24 kg/m??       Gen: Well-developed, well-nourished, in no acute distress  alert and oriented x 3  HEENT:  Pink conjunctivae, Hearing grossly normal.   Oral mucosa moist   Neck: Supple, No JVD, No Carotid Bruits,  Resp: No accessory muscle use, Clear breath sounds, No rales or rhonchi SPO2 91 % room air   Card: RRR  MSK: No cyanosis or clubbing, good capillary refill  Skin: No rashes or ulcers, no bruising  Neuro:  Cranial nerves are grossly intact, moving all four extremities, no focal deficit, follows commands appropriately  Psych:  fair insight, oriented to person, place and time, alert, Nml  Affect  LE: No edema       DATA REVIEW            Laboratory and Imaging have been reviewed by me and are notable for  Recent Labs     08/08/17  0417 08/07/17  1123   TROIQ 0.07* 0.09*     Recent Labs     08/10/17  0409 08/09/17  0345 08/08/17  0416   NA 137 137 139   K 3.8 4.2 4.1   CO2 26 23 15*   BUN CREA 1.01 0.97 0.91   GLU 101* 138* 157*   PHOS  --  3.4  --    MG 2.1 3.1* 1.8   WBC  --  10.7 4.1   HGB  --  13.0 12.8   HCT  --  38.6 37.7   PLT  --  164 156             Vincent Peyer, NP

## 2017-08-10 NOTE — Progress Notes (Addendum)
0720-Bedside report received from Dorian Heckle, Charity fundraiser. SBAR, Kardex, Procedure Summary, Intake/Output, MAR, Recent Results and Cardiac Rhythm SR to ST with PVC' were discussed.  Pt observed resting in bed, without complaint or distress.  Will assess.  Autum Irma Newness, RN  1500-I have reviewed discharge instructions with the patient.  The patient verbalized understanding. A.Shook RN

## 2017-08-10 NOTE — Progress Notes (Addendum)
Follow-up visit:    1.Life vest order received and faxed to the [employer].    2.Once insurance authorization has been obtained,pt will be fitted with the Life Vest.    3.Pt will be tested by RT with a walking oximetry to assess pt for home oxygen needs.    4.Order for hospital to home CHF has been sent through the cc  Link to [facility] for [facility] MI education.Pt has been accepted to the [facility] CHF with [employer].This is on AVS.    5.If cardiology needs case management to price out eliquis and/or  entresto,the pt.'s pharmacy of choice is [facility] on [place].    6.Pt has transfer orders to cardiology.    7.I have requested for the CM specialist to set pt up with a new PCP.This will be placed on pt.'s AVS when completed.    Addendum-New PCP appt made on 09/13/17 @ 1300 pm with Dr Shannan Harper.    8.Case Management is continuing to follow pt for discharge planning.    Beth Aristov,RN.

## 2017-08-10 NOTE — Progress Notes (Signed)
[employer] life vest rep is here fitting pt with his life vest.    Beth Aristov,RN

## 2017-08-10 NOTE — Progress Notes (Signed)
RT evaluation for home oxygen need was completed.  His resting room air O2 saturation was 96% with a heart rate of 89 bpm.  While ambulating in hallway his lowest O2 saturation was 96%.  Heart rate increased to 99 bpm.  He returned to baseline within one minute.  No supplemental oxygen was indicated.

## 2017-08-10 NOTE — Nurse Consult (Signed)
Met with patient at bedside in f/u for HF education.  Patient states he is waiting for approval for Lifevest.  He has read HF education book.  Reviewed in detail HF self care measures including early recognition of symptoms, daily weight with parameters,  2000 mg or less sodium diet with how to read a label for sodium, hidden sources of sodium, general guidelines for fluid intake, and importance of early f/u with Cardiology.  Patient has been working and will stay out of work until released by Cardiology.  Discussed staying active within symptom limitations and avoiding weather extremes.  Discussed chronic nature of heart disease and importance of continuing medications even when feeling better until directed differently by Cardiology.  Reviewed how to stay organized with medications, new medication, Eliquis, and metoprolol succinate.  Patient was engaged in information and voiced interest in exercise and staying active.  Basic information on benefits of cardiac rehab when appropriate discussed and suggested he discuss with Dr. Mort Sawyers at f/u.

## 2017-08-10 NOTE — Progress Notes (Signed)
[facility] Critical Care, [facility] Sleep Medicine  Name: Steve Green MRN: [id number]   DOB: 1960-06-19 Hospital: [facility]   Date: 08/10/2017          IMPRESSION:   ?? afib w/ RVR - s/p DCCV 5/1. Currently in SR  ?? ?COPD +/- acute exacerbation  ?? Cardiomyopathy (EF 21-25%), moderate MR  ?? Tobacco/etoh abuse      RECOMMENDATIONS:   ?? Supplemental O2 as needed to keep sats > 90%  ?? Continue xopenex/atrovent nebs, claritin, mucinex and flutter valve  ?? Prednisone taper  ?? on pepcid for GERD  ?? RVP pending  ?? Continue lasix  ?? Continue ASA  ?? Continue metoprolol, lisinopril and amio  ?? Needs PFTs and sleep study as an outpatient    DVT ppx: eliquis  GI ppx: pepcid    awaiting transfer out of the ICU     Subjective/History:    Steve Green is a 57yo male w/ no pmh who presented to the ER on 4/30 w/ complaints of shortness of breath, wheezing, chest pressure and cough productive of yellow sputum. Also, found to be in afib w/ RVR.      No prior history of asthma, COPD or OSA.  +seasonal allergies (summer worst) and GERD  Denies post nasal drip   Has symptoms suggestive of sleep apnea    FHx: negative for inheritable lung disease  SHx: smokes 1/2 ppd since age 60. Drinks a 24oz beer daily. Denies illicits. Works as a Administrator    4/29 - echo: EF 21-25%, RV normal, mod MR, PASP 43    5/2 - LHC: no CAD  -------------------------------------------------  24hr events:  No acute events overnight  Denies chest pain or shortness of breath      Prior to Admission medications    Medication Sig Start Date End Date Taking? Authorizing Provider   apixaban (ELIQUIS) 5 mg tablet Take 1 Tab by mouth two (2) times a day. 08/10/17  Yes Selena Lesser A, NP   amiodarone (CORDARONE) 200 mg tablet Take 1 Tab by mouth daily. 08/10/17  Yes Dobransky, Kathlen Mody, MD   aspirin 81 mg chewable tablet Take 1 Tab by mouth daily. 08/11/17  Yes Dobransky, Kathlen Mody, MD    lisinopril (PRINIVIL, ZESTRIL) 2.5 mg tablet Take 1 Tab by mouth nightly. 08/10/17  Yes Dobransky, Kathlen Mody, MD   metoprolol succinate (TOPROL-XL) 25 mg XL tablet Take 1 Tab by mouth daily. 08/10/17  Yes Dobransky, Kathlen Mody, MD   predniSONE (DELTASONE) 20 mg tablet Take  x 2 days then  x 2 days then stop 08/10/17  Yes Dobransky, Kathlen Mody, MD   guaiFENesin ER (MUCINEX) 600 mg ER tablet Take 600 mg by mouth two (2) times daily as needed for Congestion.   Yes Provider, Historical   acetaminophen (TYLENOL) 325 mg tablet Take 325 mg by mouth every six (6) hours as needed for Pain.   Yes Provider, Historical     Current Facility-Administered Medications   Medication Dose Route Frequency   ??? apixaban (ELIQUIS) tablet 5 mg  5 mg Oral BID   ??? metoprolol succinate (TOPROL-XL) XL tablet 25 mg  25 mg Oral DAILY   ??? lisinopril (PRINIVIL, ZESTRIL) tablet 2.5 mg  2.5 mg Oral QHS   ??? amiodarone (CORDARONE) tablet 200 mg  200 mg Oral DAILY   ??? aspirin chewable tablet 81 mg  81 mg Oral DAILY   ??? sodium chloride (NS) flush 5-40 mL  5-40 mL IntraVENous Q8H   ??? loratadine (CLARITIN) tablet 10 mg  10 mg Oral DAILY   ??? predniSONE (DELTASONE) tablet 60 mg  60 mg Oral DAILY WITH BREAKFAST   ??? guaiFENesin ER (MUCINEX) tablet 1,200 mg  1,200 mg Oral Q12H   ??? famotidine (PEPCID) tablet 20 mg  20 mg Oral BID   ??? levalbuterol (XOPENEX) nebulizer soln 1.25 mg/3 mL  1.25 mg Nebulization Q4H RT   ??? ipratropium (ATROVENT) 0.02 % nebulizer solution 0.5 mg  0.5 mg Nebulization Q4H RT   ??? sodium chloride (NS) flush 5-40 mL  5-40 mL IntraVENous Q8H   ??? sodium chloride (NS) flush 5-40 mL  5-40 mL IntraVENous Q8H     No Known Allergies   Social History     Tobacco Use   ??? Smoking status: Current Every Day Smoker     Packs/day: 0.50   Substance Use Topics   ??? Alcohol use: Yes      Family History   Problem Relation Age of Onset   ??? Heart Disease Other           Objective:  Vital Signs:    Visit Vitals  BP 96/52   Pulse 92   Temp 98.2 ??F (36.8 ??C)    Resp 25   Ht  (1.727 m)   Wt 72.3 kg (159 lb 6.3 oz)   SpO2 96%   BMI 24.24 kg/m??       O2 Device: Room air   O2 Flow Rate (L/min): 2 l/min   Temp (24hrs), Avg:98.2 ??F (36.8 ??C), Min:98 ??F (36.7 ??C), Max:98.3 ??F (36.8 ??C)     Intake/Output:   Last shift:      05/03 0701 - 05/03 1900  In: 395 [P.O.:360; I.V.:35]  Out: -   Last 3 shifts: 05/01 1901 - 05/03 0700  In: 739.4 [I.V.:739.4]  Out: 2425 [Urine:2425]    Intake/Output Summary (Last 24 hours) at 08/10/2017 1410  Last data filed at 08/10/2017 0845  Gross per 24 hour   Intake 914.38 ml   Output 625 ml   Net 289.38 ml     Hemodynamics:   PAP:   CO:     Wedge:   CI:     CVP:    SVR:       PVR:       Ventilator Settings:  Mode Rate Tidal Volume Pressure FiO2 PEEP                    Peak airway pressure:      Minute ventilation:        Physical Exam:    General:  Alert, cooperative, no distress, appears stated age.   Head:     Eyes:     Nose:    Throat:    Neck:    Back:      Lungs:   Diminished but clear   Chest wall:     Heart:  RRR   Abdomen:   Soft, non-tender. Bowel sounds normal   Extremities: Extremities normal, atraumatic, no cyanosis or edema.   Pulses:    Skin: No rashes or lesions   Lymph nodes:    Neurologic: Oriented x 3       Data:     Recent Results (from the past 24 hour(s))   CARBOXY HEMOGLOBIN    Collection Time: 08/09/17  3:38 PM   Result Value Ref Range    Carboxy-Hgb 1.1 1 - 2 %  Methemoglobin 0.4 0 - 1.4 %    tHb 13.3 (L) 14 - 17 g/dL    Oxyhemoglobin 16.1 (LL) 94 - 97 %    O2 SATURATION 73 (L) 95 - 99 %   CARBOXY HEMOGLOBIN    Collection Time: 08/09/17  3:38 PM   Result Value Ref Range    Carboxy-Hgb 1.2 1 - 2 %    Methemoglobin 0.2 0 - 1.4 %    tHb 13.5 (L) 14 - 17 g/dL    Oxyhemoglobin 09.6 94 - 97 %    O2 SATURATION 97 95 - 99 %   METABOLIC PANEL, BASIC    Collection Time: 08/10/17  4:09 AM   Result Value Ref Range    Sodium 137 136 - 145 mmol/L    Potassium 3.8 3.5 - 5.1 mmol/L    Chloride 106 97 - 108 mmol/L    CO2 26 21 - 32 mmol/L     Anion gap 5 5 - 15 mmol/L    Glucose 101 (H) 65 - 100 mg/dL    BUN 16 6 - 20 MG/DL    Creatinine 0.45 4.09 - 1.30 MG/DL    BUN/Creatinine ratio 16 12 - 20      GFR est AA >60 >60 ml/min/1.62m2    GFR est non-AA >60 >60 ml/min/1.16m2    Calcium 8.5 8.5 - 10.1 MG/DL   MAGNESIUM    Collection Time: 08/10/17  4:09 AM   Result Value Ref Range    Magnesium 2.1 1.6 - 2.4 mg/dL   NT-PRO BNP    Collection Time: 08/10/17  4:09 AM   Result Value Ref Range    NT pro-BNP 5,832 (H) <125 PG/ML                 Imaging:  I have personally reviewed the patient???s radiographs and have reviewed the reports:            Cira Rue, MD

## 2017-08-10 NOTE — Other (Deleted)
Pt admitted with AFIB and Type 2 MI and noted systolic CHF documented.   Please further specify type and acuity of CHF in the medical record and state if POA.     => Acute Systolic CHF POA   => Acute on Chronic Systolic CHF POA   => Chronic Systolic CHF  => Other, please specify  => Clinically unable to determine    The medical record reflects the following:    Risk Factors:  57 yo M admitted with AFIB &  Type 2 MI     Clinical Indicators: 4/29 CXR: cardiomegaly and pulmonary edema  ProBNP 8904   5/2 PN "Systolic CHF - ?2/2 a-fib v. Underlying ischemia "    Treatment: IV Lasix 20 mg every day     Thank you for your time   [facility] RN/BSN, CCDS  Desk:   [phone]   Other:  [phone]

## 2017-08-10 NOTE — Progress Notes (Signed)
Per walking oximetry,pt does not meet criteria for home oxygen.  I contacted [facility] on [place] regarding pt.'s eliquis.  The cost for the eliquis will be zero dollars/month.  Once pt is fitted with his life vest,he will be discharged.  [facility] accepted pt for their [facility] CHF visit.    Beth Aristov,RN

## 2017-08-10 NOTE — Other (Signed)
Cardiac Rehab: Met briefly with [name] prior to discharge from CCU. Discussed pt's eligibility for outpatient cardiac rehab program. Provided printed info on [facility] cardiac rehab. Pt indicated transportation would be a problem. Pt was aware Medicaid may pay for transportation. Encouraged pt to discuss cardiac rehab with Dr Mort Sawyers during f/u visit. Pt had no questions.

## 2017-08-10 NOTE — Discharge Summary (Signed)
Physician Discharge Summary     Patient ID:  Steve Green  [id number]  57 y.o.  04/10/1961    Admit date: 08/06/2017    Discharge date and time: 08/10/2017     Admission Diagnoses: Atrial fibrillation with RVR (HCC) [I48.91]    Discharge Diagnoses/Hospital Course   A flutter with RVR (HCC) (08/06/2017) - now s/p cardioversion and in NSR. Now on amiodarone and Eliquis  ??  Chest pain (08/06/2017) - ?2/2 a-fib. Resolved. Heart cath without significant CAD   ??  Hyperglycemia (08/06/2017) - A1c 5.9 therefore NOT c/w DM   ??  Elevated troponin (08/06/2017) - likely type 2 MI from demand from a-fib. As above, L heart cath without significant CAD   ??  Acute systolic CHF - improved with diuresis. Evaluated by cardiology and now on Toprol XL, ACE I and has a LifeVest. Will need close cardiology follow-up  ??  COPD - not formally diagnosed but likely considering tobacco use and wheezing. Improved with nebs. Will need outpt PFT's once acute issues resolve. Passed 6 minute walk prior to d.c     PCP: None     Consults: Cardiology and Pulmonary/Intensive care    Discharge Exam:  Visit Vitals  BP 107/67 (BP 1 Location: Left arm, BP Patient Position: Sitting)   Pulse 87   Temp 98.2 ??F (36.8 ??C)   Resp 22   Ht 5' 8" (1.727 m)   Wt 72.3 kg (159 lb 6.3 oz)   SpO2 96%   BMI 24.24 kg/m??     Gen:  Well-developed, well-nourished, in no acute distress  HEENT:  Pink conjunctivae, PERRL, hearing intact to voice, moist mucous membranes  Neck:  Supple, without masses, thyroid non-tender  Resp:  CTA B/L. No wheezing   Card: No MRG. RRR    Abd:  Soft, non-tender, non-distended, normoactive bowel sounds are present  Musc:  No cyanosis or clubbing  Skin:  No rashes or ulcers, skin turgor is good  Neuro:  Cranial nerves 3-12 are grossly intact, grip strength is 5/5 bilaterally and dorsi / plantarflexion is 5/5 bilaterally, follows commands appropriately  Psych:  Good insight, oriented to person, place and time, alert  Trace bilateral LE edema improved      Disposition: home    Patient Instructions:   Discharge Medication List as of 08/10/2017  3:08 PM      START taking these medications    Details   aspirin 81 mg chewable tablet Take 1 Tab by mouth daily., No Print, Disp-30 Tab, R-0         CONTINUE these medications which have CHANGED    Details   apixaban (ELIQUIS) 5 mg tablet Take 1 Tab by mouth two (2) times a day., Normal, Disp-60 Tab, R-0      amiodarone (CORDARONE) 200 mg tablet Take 1 Tab by mouth daily., Print, Disp-30 Tab, R-0      lisinopril (PRINIVIL, ZESTRIL) 2.5 mg tablet Take 1 Tab by mouth nightly., Print, Disp-30 Tab, R-0      metoprolol succinate (TOPROL-XL) 25 mg XL tablet Take 1 Tab by mouth daily., Print, Disp-30 Tab, R-0      predniSONE (DELTASONE) 20 mg tablet Take 40mg x 2 days then 20mg x 2 days then stop, Print, Disp-6 Tab, R-0         STOP taking these medications       guaiFENesin ER (MUCINEX) 600 mg ER tablet Comments:   Reason for Stopping:           acetaminophen (TYLENOL) 325 mg tablet Comments:   Reason for Stopping:             Activity: Activity as tolerated  Diet: Cardiac Diet  Wound Care: None needed    Follow-up Information     Follow up With Specialties Details Why Contact Info    Doloresco, Mark, MD Cardiology On 08/14/2017 3:40 pm [address]  [address]  [place] [address]  [phone]       Family Medicine Residency  Go on 09/13/2017 For new patient appointment at 1:00PM with Dr. Aaron Ferro  [address]  [place] [address]  [phone]    None    None ([phone]) Patient stated that they have no PCP          Approximate time spent in patient care on day of discharge: 35 minutes     Signed:  Adele Milson N Meygan Kyser, MD  08/15/2017  10:12 PM

## 2017-08-10 NOTE — Discharge Summary (Signed)
Physician Discharge Summary     Patient ID:  Steve Green  [id number]  57 y.o.  1961/04/08    Admit date: 08/06/2017    Discharge date and time: 08/10/2017     Admission Diagnoses: Atrial fibrillation with RVR [facility]) [I48.91]    Discharge Diagnoses/Hospital Course   A flutter with RVR (HCC) (08/06/2017) - now s/p cardioversion and in NSR. Now on amiodarone and Eliquis  ??  Chest pain (08/06/2017) - ?2/2 a-fib. Resolved. Heart cath without significant CAD   ??  Hyperglycemia (08/06/2017) - A1c 5.9 therefore NOT c/w DM   ??  Elevated troponin (08/06/2017) - likely type 2 MI from demand from a-fib. As above, L heart cath without significant CAD   ??  Acute systolic CHF - improved with diuresis. Evaluated by cardiology and now on Toprol XL, ACE I and has a LifeVest. Will need close cardiology follow-up  ??  COPD - not formally diagnosed but likely considering tobacco use and wheezing. Improved with nebs. Will need outpt PFT's once acute issues resolve. Passed 6 minute walk prior to d.c     PCP: None     Consults: Cardiology and Pulmonary/Intensive care    Discharge Exam:  Visit Vitals  BP 107/67 (BP 1 Location: Left arm, BP Patient Position: Sitting)   Pulse 87   Temp 98.2 ??F (36.8 ??C)   Resp 22   Ht  (1.727 m)   Wt 72.3 kg (159 lb 6.3 oz)   SpO2 96%   BMI 24.24 kg/m??     Gen:  Well-developed, well-nourished, in no acute distress  HEENT:  Pink conjunctivae, PERRL, hearing intact to voice, moist mucous membranes  Neck:  Supple, without masses, thyroid non-tender  Resp:  CTA B/L. No wheezing   Card: No MRG. RRR    Abd:  Soft, non-tender, non-distended, normoactive bowel sounds are present  Musc:  No cyanosis or clubbing  Skin:  No rashes or ulcers, skin turgor is good  Neuro:  Cranial nerves 3-12 are grossly intact, grip strength is 5/5 bilaterally and dorsi / plantarflexion is 5/5 bilaterally, follows commands appropriately  Psych:  Good insight, oriented to person, place and time, alert  Trace bilateral LE edema improved      Disposition: home    Patient Instructions:   Discharge Medication List as of 08/10/2017  3:08 PM      START taking these medications    Details   aspirin 81 mg chewable tablet Take 1 Tab by mouth daily., No Print, Disp-30 Tab, R-0         CONTINUE these medications which have CHANGED    Details   apixaban (ELIQUIS) 5 mg tablet Take 1 Tab by mouth two (2) times a day., Normal, Disp-60 Tab, R-0      amiodarone (CORDARONE) 200 mg tablet Take 1 Tab by mouth daily., Print, Disp-30 Tab, R-0      lisinopril (PRINIVIL, ZESTRIL) 2.5 mg tablet Take 1 Tab by mouth nightly., Print, Disp-30 Tab, R-0      metoprolol succinate (TOPROL-XL) 25 mg XL tablet Take 1 Tab by mouth daily., Print, Disp-30 Tab, R-0      predniSONE (DELTASONE) 20 mg tablet Take  x 2 days then  x 2 days then stop, Print, Disp-6 Tab, R-0         STOP taking these medications       guaiFENesin ER (MUCINEX) 600 mg ER tablet Comments:   Reason for Stopping:  acetaminophen (TYLENOL) 325 mg tablet Comments:   Reason for Stopping:             Activity: Activity as tolerated  Diet: Cardiac Diet  Wound Care: None needed    Follow-up Information     Follow up With Specialties Details Why Contact Info    Rushie Chestnut, MD Cardiology On 08/14/2017 3:40 pm [address] [place] [address] [place] [address]  [phone]      [facility]  Go on 09/13/2017 For new patient appointment at 1:00PM with Dr. Kaleen Odea  [address]  [place] [address]  [phone]    None    None [phone]) Patient stated that they have no PCP          Approximate time spent in patient care on day of discharge: 35 minutes     Signed:  Gaspar Bidding, MD  08/15/2017  10:12 PM

## 2017-08-13 ENCOUNTER — Encounter: Admit: 2017-08-13 | Discharge: 2017-08-13 | Primary: Student in an Organized Health Care Education/Training Program

## 2017-08-14 ENCOUNTER — Encounter

## 2017-08-14 ENCOUNTER — Ambulatory Visit
Admit: 2017-08-14 | Discharge: 2017-08-14 | Payer: PRIVATE HEALTH INSURANCE | Attending: Specialist | Primary: Student in an Organized Health Care Education/Training Program

## 2017-08-14 DIAGNOSIS — I4891 Unspecified atrial fibrillation: Secondary | ICD-10-CM

## 2017-08-14 NOTE — Progress Notes (Signed)
Visit Vitals  BP 108/68 (BP 1 Location: Left arm)   Pulse 75   Resp 18   Ht  (1.727 m)   Wt 155 lb 6.4 oz (70.5 kg)   BMI 23.63 kg/m??       Patient is here for hospital follow up. No complaints.

## 2017-08-14 NOTE — Progress Notes (Signed)
LAST OFFICE VISIT : Visit date not found        ICD-10-CM ICD-9-CM   1. Atrial fibrillation with RVR [facility]) I48.91 427.31       Steve Green is a 57 y.o. year old male, he is seen today for Transition of Care services following a hospital discharge for afib with RVR on 5/3.  Our office Nurse Navigator performed an outreach to Steve Green secondary to two working days (within 2 business days of discharge) to complete medication reconciliation and a telephonic assessment of his condition.      Cardiac risk factors: male gender, hypertension  I have personally obtained the history from the patient.    HISTORY OF PRESENTING ILLNESS     Overall the pt states he is doing well and has been slowly becoming more active, and he feels much more improved.      The patient denies chest pain/ shortness of breath, orthopnea, PND, LE edema, palpitations, syncope, presyncope or fatigue.         ACTIVE PROBLEM LIST     Patient Active Problem List    Diagnosis Date Noted   ??? Systolic CHF, chronic ([place]) 08/07/2017   ??? Atrial fibrillation with RVR ([place]) 08/06/2017   ??? Chest pain 08/06/2017   ??? Hyperglycemia 08/06/2017   ??? Elevated troponin 08/06/2017           PAST MEDICAL HISTORY     No past medical history on file.        PAST SURGICAL HISTORY     No past surgical history on file.       ALLERGIES     No Known Allergies       FAMILY HISTORY     Family History   Problem Relation Age of Onset   ??? Heart Disease Other     negative for cardiac disease       SOCIAL HISTORY     Social History     Socioeconomic History   ??? Marital status: SINGLE     Spouse name: Not on file   ??? Number of children: Not on file   ??? Years of education: Not on file   ??? Highest education level: Not on file   Tobacco Use   ??? Smoking status: Current Every Day Smoker     Packs/day: 0.50   ??? Smokeless tobacco: Never Used   Substance and Sexual Activity   ??? Alcohol use: Yes   ??? Drug use: Never   ??? Sexual activity: Yes     Partners: Female         MEDICATIONS      Current Outpatient Medications   Medication Sig   ??? apixaban (ELIQUIS) 5 mg tablet Take 1 Tab by mouth two (2) times a day.   ??? amiodarone (CORDARONE) 200 mg tablet Take 1 Tab by mouth daily.   ??? aspirin 81 mg chewable tablet Take 1 Tab by mouth daily.   ??? lisinopril (PRINIVIL, ZESTRIL) 2.5 mg tablet Take 1 Tab by mouth nightly.   ??? metoprolol succinate (TOPROL-XL) 25 mg XL tablet Take 1 Tab by mouth daily.   ??? predniSONE (DELTASONE) 20 mg tablet Take 13m x 2 days then 232mx 2 days then stop     No current facility-administered medications for this visit.        I have reviewed the nurses notes, vitals, problem list, allergy list, medical history, family, social history and medications.       REVIEW  OF SYMPTOMS      General: Pt denies excessive weight gain or loss. Pt is able to conduct ADL's  HEENT: Denies blurred vision, headaches, hearing loss, epistaxis and difficulty swallowing.  Respiratory: Denies cough, congestion, shortness of breath, DOE, wheezing or stridor.  Cardiovascular: Denies precordial pain, palpitations, edema or PND  Gastrointestinal: Denies poor appetite, indigestion, abdominal pain or blood in stool  Genitourinary: Denies hematuria, dysuria, increased urinary frequency  Musculoskeletal: Denies joint pain or swelling from muscles or joints  Neurologic: Denies tremor, paresthesias, headache, or sensory motor disturbance  Psychiatric: Denies confusion, insomnia, depression  Integumentray: Denies rash, itching or ulcers.  Hematologic: Denies easy bruising, bleeding     PHYSICAL EXAMINATION      Vitals:    08/14/17 1559   BP: 108/68   Pulse: 75   Resp: 18   Weight: 155 lb 6.4 oz (70.5 kg)   Height: '5\' 8"'  (1.727 m)     General: Well developed, in no acute distress.  HEENT: No jaundice, oral mucosa moist, no oral ulcers  Neck: Supple, no stiffness, no lymphadenopathy, supple  Heart: ??Normal S1/S2 negative S3 or S4. Regular, no murmur, gallop or rub, no jugular venous distention   Respiratory: Clear bilaterally x 4, no wheezing or rales  Abdomen:?? ??Soft, non-tender, bowel sounds are active.??  Extremities:  No edema, normal cap refill, no cyanosis.  Musculoskeletal: No clubbing, no deformities  Neuro: A&Ox3, speech clear, gait stable, cooperative, no focal neurologic deficits  Skin: Skin color is normal. No rashes or lesions. Non diaphoretic, moist.  Vascular: 2+ pulses symmetric in all extremities    EKG: SR with nonspecific STT changes.     DIAGNOSTIC DATA     1. Cardiac Cath  08/09/17-  Normal Coronary artery anatomy??  PCWP 22??  C.O /C.I 5.69/3.06??  PA sat 73%    2. Echo  08/07/17-EF 16-20%, Severely dilated left ventricle, LAE mod, mild TV stenosis,   MV thickening. 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         LABORATORY DATA            Lab Results   Component Value Date/Time    WBC 10.7 08/09/2017 03:45 AM    HGB 13.0 08/09/2017 03:45 AM    HCT 38.6 08/09/2017 03:45 AM    PLATELET 164 08/09/2017 03:45 AM    MCV 98.2 08/09/2017 03:45 AM      Lab Results   Component Value Date/Time    Sodium 137 08/10/2017 04:09 AM    Potassium 3.8 08/10/2017 04:09 AM    Chloride 106 08/10/2017 04:09 AM    CO2 26 08/10/2017 04:09 AM    Anion gap 5 08/10/2017 04:09 AM    Glucose 101 (H) 08/10/2017 04:09 AM    BUN 16 08/10/2017 04:09 AM    Creatinine 1.01 08/10/2017 04:09 AM    BUN/Creatinine ratio 16 08/10/2017 04:09 AM    GFR est AA >60 08/10/2017 04:09 AM    GFR est non-AA >60 08/10/2017 04:09 AM    Calcium 8.5 08/10/2017 04:09 AM    Bilirubin, total 0.4 08/09/2017 03:45 AM    AST (SGOT) 31 08/09/2017 03:45 AM    Alk. phosphatase 120 (H) 08/09/2017 03:45 AM    Protein, total 6.3 (L) 08/09/2017 03:45 AM    Albumin 3.2 (L) 08/09/2017 03:45 AM    Globulin 3.1 08/09/2017 03:45 AM    A-G Ratio 1.0 (L) 08/09/2017 03:45 AM    ALT (SGPT) 75 08/09/2017 03:45 AM  ASSESSMENT/RECOMMENDATIONS:.      1. Aflutter  - He was cardioverted and was in SR on Amiodarone 200 mg daily.  Will need  to f/u surveillance lab for TSH.  No bleeding on Eliquis at 5 mg.  Today's EKG demonstrated NSR with Nonspecific STT changes.    2. ICM  - Ideally I would like him to be on Entresto.  But his BP has not been permitted at this time so he remains on Lisinopril and Metoprolol.  I will have him to be seen by Dr. Kathyrn Sheriff for possible ICD.  He's wearing a life vest.  I have ordered a repeat echo in 2 mo.   - Will also have him  seen by HF clinic at the [facility] office   -I am hoping with treatment of HF and NSR that his EF will improve.  3. Screening cholesterol  - Will give him a lab slip to have his cholesterol checked.  4. Return in 2 months or PRN.    Orders Placed This Encounter   ??? HEPATIC FUNCTION PANEL     Standing Status:   Future     Standing Expiration Date:   08/15/2018   ??? LIPID PANEL   ??? AMB POC EKG ROUTINE W/ 12 LEADS, INTER & REP     Order Specific Question:   Reason for Exam:     Answer:   afib              I have discussed the diagnosis with  Steve Green and the intended plan as seen in the above orders.  Questions were answered concerning future plans.  I have discussed medication side effects and warnings with the patient as well.    Thank you,  None for involving me in the care of  Steve Green. Please do not hesitate to contact me for further questions/concerns.     Written by Mercie Eon, [place], as dictated by Vennie Homans, MD.     Steve HowDoloresco,  MD, [facility]    Patient Care Team:  None as PCP - General  Vennie Homans, MD (Cardiology)    [facility]      [address] [place], [place]     [place], [place]      [phone] / [phone] Fax

## 2017-08-15 NOTE — Progress Notes (Signed)
Referred pt to Heart Failure Clinic - Dx SHF/wearing life vest.

## 2017-08-15 NOTE — Telephone Encounter (Signed)
LVM to dscuss New Patient appointment

## 2017-08-16 NOTE — Telephone Encounter (Signed)
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

## 2017-08-16 NOTE — Telephone Encounter (Signed)
Pt is calling asking when he can go back to work please call him at  [phone]

## 2017-08-17 NOTE — Telephone Encounter (Signed)
Mailbox full

## 2017-08-21 ENCOUNTER — Encounter: Admit: 2017-08-21 | Discharge: 2017-08-21 | Primary: Student in an Organized Health Care Education/Training Program

## 2017-08-21 NOTE — Telephone Encounter (Signed)
Sonya from home health would like to speak with you on pt. pls call [phone]. thanks

## 2017-08-21 NOTE — Telephone Encounter (Signed)
Not until seen by heart failure.

## 2017-08-21 NOTE — Telephone Encounter (Signed)
Pt wants to know if he can go back to work. 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 Currently wearing a life vest and is to get an appointment with the heart failure clinic

## 2017-08-21 NOTE — Telephone Encounter (Signed)
Informed Sonya of MD response

## 2017-08-29 ENCOUNTER — Ambulatory Visit
Admit: 2017-08-29 | Discharge: 2017-08-29 | Payer: PRIVATE HEALTH INSURANCE | Attending: Cardiovascular Disease | Primary: Student in an Organized Health Care Education/Training Program

## 2017-08-29 ENCOUNTER — Ambulatory Visit: Attending: Cardiovascular Disease | Primary: Student in an Organized Health Care Education/Training Program

## 2017-08-29 DIAGNOSIS — I4891 Unspecified atrial fibrillation: Secondary | ICD-10-CM

## 2017-08-29 NOTE — Progress Notes (Signed)
HISTORY OF PRESENTING ILLNESS      Steve Green is a 57 y.o. male with nonischemic cardiomyopathy, LVEF 15-20% (echocardiogram 08/07/17), moderate MR, atrial flutter s/p DCCV (on amiodarone) referred to discuss atrial flutter and ICD therapy for primary prevention of sudden death. He is currently on guideline directed medical therapy. He is on eliquis for CVA risk reduction.        ACTIVE PROBLEM LIST     Patient Active Problem List    Diagnosis Date Noted   ??? Systolic CHF, chronic ([place]) 08/07/2017   ??? Atrial fibrillation with RVR ([place]) 08/06/2017   ??? Chest pain 08/06/2017   ??? Hyperglycemia 08/06/2017   ??? Elevated troponin 08/06/2017           PAST MEDICAL HISTORY     No past medical history on file.        PAST SURGICAL HISTORY     No past surgical history on file.       ALLERGIES     No Known Allergies       FAMILY HISTORY     Family History   Problem Relation Age of Onset   ??? Heart Disease Other     negative for cardiac disease       SOCIAL HISTORY     Social History     Socioeconomic History   ??? Marital status: SINGLE     Spouse name: Not on file   ??? Number of children: Not on file   ??? Years of education: Not on file   ??? Highest education level: Not on file   Tobacco Use   ??? Smoking status: Current Every Day Smoker     Packs/day: 0.50   ??? Smokeless tobacco: Never Used   Substance and Sexual Activity   ??? Alcohol use: Yes   ??? Drug use: Never   ??? Sexual activity: Yes     Partners: Female         MEDICATIONS     Current Outpatient Medications   Medication Sig   ??? apixaban (ELIQUIS) 5 mg tablet Take 1 Tab by mouth two (2) times a day.   ??? amiodarone (CORDARONE) 200 mg tablet Take 1 Tab by mouth daily.   ??? aspirin 81 mg chewable tablet Take 1 Tab by mouth daily.   ??? lisinopril (PRINIVIL, ZESTRIL) 2.5 mg tablet Take 1 Tab by mouth nightly.   ??? metoprolol succinate (TOPROL-XL) 25 mg XL tablet Take 1 Tab by mouth daily.   ??? predniSONE (DELTASONE) 20 mg tablet Take '40mg'$  x 2 days then '20mg'$  x 2 days then stop      No current facility-administered medications for this visit.        I have reviewed the nurses notes, vitals, problem list, allergy list, medical history, family, social history and medications.       REVIEW OF SYMPTOMS      General: Pt denies excessive weight gain or loss. Pt is able to conduct ADL's  HEENT: Denies blurred vision, headaches, hearing loss, epistaxis and difficulty swallowing.  Respiratory: Denies cough, congestion, shortness of breath, DOE, wheezing or stridor.  Cardiovascular: Denies precordial pain, palpitations, edema or PND  Gastrointestinal: Denies poor appetite, indigestion, abdominal pain or blood in stool  Genitourinary: Denies hematuria, dysuria, increased urinary frequency  Musculoskeletal: Denies joint pain or swelling from muscles or joints  Neurologic: Denies tremor, paresthesias, headache, or sensory motor disturbance  Psychiatric: Denies confusion, insomnia, depression  Integumentray: Denies rash, itching or ulcers.  Hematologic: Denies easy bruising, bleeding       PHYSICAL EXAMINATION      There were no vitals filed for this visit.  General: Well developed, in no acute distress.  HEENT: No jaundice, oral mucosa moist, no oral ulcers  Neck: Supple, no stiffness, no lymphadenopathy, supple  Heart: ??Normal S1/S2 negative S3 or S4. Regular, no murmur, gallop or rub, no jugular venous distention  Respiratory: Clear bilaterally x 4, no wheezing or rales  Abdomen:?? ??Soft, non-tender, bowel sounds are active.??  Extremities:  No edema, normal cap refill, no cyanosis.  Musculoskeletal: No clubbing, no deformities  Neuro: A&Ox3, speech clear, gait stable, cooperative, no focal neurologic deficits  Skin: Skin color is normal. No rashes or lesions. Non diaphoretic, moist.  Vascular: 2+ pulses symmetric in all extremities       DIAGNOSTIC DATA      EKG:        LABORATORY DATA      Lab Results   Component Value Date/Time    WBC 10.7 08/09/2017 03:45 AM    HGB 13.0 08/09/2017 03:45 AM     HCT 38.6 08/09/2017 03:45 AM    PLATELET 164 08/09/2017 03:45 AM    MCV 98.2 08/09/2017 03:45 AM      Lab Results   Component Value Date/Time    Sodium 137 08/10/2017 04:09 AM    Potassium 3.8 08/10/2017 04:09 AM    Chloride 106 08/10/2017 04:09 AM    CO2 26 08/10/2017 04:09 AM    Anion gap 5 08/10/2017 04:09 AM    Glucose 101 (H) 08/10/2017 04:09 AM    BUN 16 08/10/2017 04:09 AM    Creatinine 1.01 08/10/2017 04:09 AM    BUN/Creatinine ratio 16 08/10/2017 04:09 AM    GFR est AA >60 08/10/2017 04:09 AM    GFR est non-AA >60 08/10/2017 04:09 AM    Calcium 8.5 08/10/2017 04:09 AM    Bilirubin, total 0.4 08/09/2017 03:45 AM    AST (SGOT) 31 08/09/2017 03:45 AM    Alk. phosphatase 120 (H) 08/09/2017 03:45 AM    Protein, total 6.3 (L) 08/09/2017 03:45 AM    Albumin 3.2 (L) 08/09/2017 03:45 AM    Globulin 3.1 08/09/2017 03:45 AM    A-G Ratio 1.0 (L) 08/09/2017 03:45 AM    ALT (SGPT) 75 08/09/2017 03:45 AM           ASSESSMENT      1. Cardiomyopathy   A. NICM   B. LVEF 20-25%   C. NYHA II   D. QRS duration 96 msec  2. Atrial flutter   A. DCCV   B. Amiodarone  3. Mitral regurgitation   A. Moderate       PLAN     Discussed atrial flutter ablation; he wishes to defer at this time. Repeat echocardiogram in 2 months and if EF < 35%, plan for single chamber ICD implantation for primary prevention of sudden death.        FOLLOW-UP     2 months       Thank you, Dr. Mort Sawyers for allowing me to participate in the care of this extraordinarily pleasant male. Please do not hesitate to contact me for further questions/concerns.         Candis Schatz, MD  Cardiac Electrophysiology / Cardiology    [facility]  [address]  [address]  [place], [place]    [place], [place]  [phone] / [phone] Fax   [phone] / [phone] Fax

## 2017-08-29 NOTE — Progress Notes (Signed)
Room # 3    Life Vest    Needs refills on all medications    Denies any cardiac complaints at this time.    Visit Vitals  BP 110/84 (BP 1 Location: Left arm, BP Patient Position: Sitting)   Pulse 72   Resp 16   Ht 5' 8" (1.727 m)   Wt 153 lb 3.2 oz (69.5 kg)   SpO2 98%   BMI 23.29 kg/m??

## 2017-08-29 NOTE — Progress Notes (Signed)
 Room # 3    Life Vest    Needs refills on all medications    Denies any cardiac complaints at this time.    Visit Vitals  BP 110/84 (BP 1 Location: Left arm, BP Patient Position: Sitting)   Pulse 72   Resp 16   Ht 5' 8 (1.727 m)   Wt 153 lb 3.2 oz (69.5 kg)   SpO2 98%   BMI 23.29 kg/m

## 2017-08-29 NOTE — Progress Notes (Signed)
Progress  Notes by Gailen Shelter, MD at 08/29/17 1340                Author: Gailen Shelter, MD  Service: --  Author Type: Physician       Filed: 08/29/17 1420  Encounter Date: 08/29/2017  Status: Signed          Editor: Gailen Shelter, MD (Physician)                                  HISTORY OF PRESENTING ILLNESS         Steve Green is a 57 y.o.  male with nonischemic cardiomyopathy, LVEF 15-20% (echocardiogram 08/07/17), moderate MR, atrial flutter s/p DCCV (on amiodarone) referred to  discuss atrial flutter and ICD therapy for primary prevention of sudden death. He is currently on guideline directed medical therapy. He is on eliquis for CVA risk reduction.             ACTIVE PROBLEM LIST          Patient Active Problem List           Diagnosis  Date Noted         ?  Systolic CHF, chronic ([place])  08/07/2017     ?  Atrial fibrillation with RVR ([place])  08/06/2017     ?  Chest pain  08/06/2017     ?  Hyperglycemia  08/06/2017         ?  Elevated troponin  08/06/2017                  PAST MEDICAL HISTORY        No past medical history on file.             PAST SURGICAL HISTORY        No past surgical history on file.            ALLERGIES        No Known Allergies            FAMILY HISTORY          Family History         Problem  Relation  Age of Onset          ?  Heart Disease  Other        negative for cardiac disease            SOCIAL HISTORY          Social History          Socioeconomic History         ?  Marital status:  SINGLE              Spouse name:  Not on file         ?  Number of children:  Not on file     ?  Years of education:  Not on file     ?  Highest education level:  Not on file       Tobacco Use         ?  Smoking status:  Current Every Day Smoker              Packs/day:  0.50         ?  Smokeless tobacco:  Never Used       Substance and Sexual Activity         ?  Alcohol use:  Yes     ?  Drug use:  Never     ?  Sexual activity:  Yes              Partners:  Female                MEDICATIONS           Current Outpatient Medications        Medication  Sig         ?  apixaban (ELIQUIS) 5 mg tablet  Take 1 Tab by mouth two (2) times a day.     ?  amiodarone (CORDARONE) 200 mg tablet  Take 1 Tab by mouth daily.     ?  aspirin 81 mg chewable tablet  Take 1 Tab by mouth daily.     ?  lisinopril (PRINIVIL, ZESTRIL) 2.5 mg tablet  Take 1 Tab by mouth nightly.     ?  metoprolol succinate (TOPROL-XL) 25 mg XL tablet  Take 1 Tab by mouth daily.         ?  predniSONE (DELTASONE) 20 mg tablet  Take '40mg'$  x 2 days then '20mg'$  x 2 days then stop          No current facility-administered medications for this visit.            I have reviewed the nurses notes, vitals, problem list, allergy list, medical history, family, social history and medications.            REVIEW OF SYMPTOMS         General: Pt denies excessive weight gain or loss. Pt is able to conduct ADL's   HEENT: Denies blurred vision, headaches, hearing loss, epistaxis and difficulty swallowing.   Respiratory: Denies cough, congestion, shortness of breath, DOE, wheezing or stridor.   Cardiovascular: Denies precordial pain, palpitations, edema or PND   Gastrointestinal: Denies poor appetite, indigestion, abdominal pain or blood in stool   Genitourinary: Denies hematuria, dysuria, increased urinary frequency   Musculoskeletal: Denies joint pain or swelling from muscles or joints   Neurologic: Denies tremor, paresthesias, headache, or sensory motor disturbance   Psychiatric: Denies confusion, insomnia, depression   Integumentray: Denies rash, itching or ulcers.   Hematologic: Denies easy bruising, bleeding            PHYSICAL EXAMINATION         There were no vitals filed for this visit.   General: Well developed, in no acute distress.   HEENT: No jaundice, oral mucosa moist, no oral ulcers   Neck: Supple, no stiffness, no lymphadenopathy, supple   Heart: ??Normal S1/S2 negative S3 or S4. Regular, no murmur, gallop or rub, no jugular venous distention   Respiratory:  Clear bilaterally x 4, no wheezing or rales   Abdomen:?? ??Soft, non-tender, bowel sounds are active.??   Extremities:  No edema, normal cap refill, no cyanosis.   Musculoskeletal: No clubbing, no deformities   Neuro: A&Ox3, speech clear, gait stable, cooperative, no focal neurologic deficits   Skin: Skin color is normal. No rashes or lesions. Non diaphoretic, moist.   Vascular: 2+ pulses symmetric in all extremities            DIAGNOSTIC DATA         EKG:             LABORATORY DATA           Lab Results  Component  Value  Date/Time            WBC  10.7  08/09/2017 03:45 AM       HGB  13.0  08/09/2017 03:45 AM       HCT  38.6  08/09/2017 03:45 AM       PLATELET  164  08/09/2017 03:45 AM            MCV  98.2  08/09/2017 03:45 AM           Lab Results         Component  Value  Date/Time            Sodium  137  08/10/2017 04:09 AM       Potassium  3.8  08/10/2017 04:09 AM       Chloride  106  08/10/2017 04:09 AM       CO2  26  08/10/2017 04:09 AM       Anion gap  5  08/10/2017 04:09 AM       Glucose  101 (H)  08/10/2017 04:09 AM       BUN  16  08/10/2017 04:09 AM       Creatinine  1.01  08/10/2017 04:09 AM       BUN/Creatinine ratio  16  08/10/2017 04:09 AM       GFR est AA  >60  08/10/2017 04:09 AM       GFR est non-AA  >60  08/10/2017 04:09 AM       Calcium  8.5  08/10/2017 04:09 AM       Bilirubin, total  0.4  08/09/2017 03:45 AM       AST (SGOT)  31  08/09/2017 03:45 AM       Alk. phosphatase  120 (H)  08/09/2017 03:45 AM       Protein, total  6.3 (L)  08/09/2017 03:45 AM       Albumin  3.2 (L)  08/09/2017 03:45 AM       Globulin  3.1  08/09/2017 03:45 AM       A-G Ratio  1.0 (L)  08/09/2017 03:45 AM            ALT (SGPT)  75  08/09/2017 03:45 AM                  ASSESSMENT         1. Cardiomyopathy    A. NICM    B. LVEF 20-25%    C. NYHA II    D. QRS duration 96 msec   2. Atrial flutter    A. DCCV    B. Amiodarone   3. Mitral regurgitation    A. Moderate            PLAN        Discussed atrial flutter  ablation; he wishes to defer at this time. Repeat echocardiogram in 2 months and if EF < 35%, plan for single chamber ICD implantation for primary prevention of sudden death.             FOLLOW-UP        2 months          Thank you, Dr. Mort Sawyers for allowing me to participate in the care of this extraordinarily pleasant male. Please do not hesitate to contact  me for further questions/concerns.             Candis Schatz, MD   Cardiac  Electrophysiology / Cardiology      [facility]   [address], [address], [address], [place]    [place], [place]   [phone] / [phone] Fax   [phone] / [phone] Fax

## 2017-09-05 NOTE — Telephone Encounter (Signed)
LVM for appt reminder

## 2017-09-06 ENCOUNTER — Ambulatory Visit
Admit: 2017-09-06 | Discharge: 2017-09-06 | Payer: PRIVATE HEALTH INSURANCE | Attending: Cardiovascular Disease | Primary: Student in an Organized Health Care Education/Training Program

## 2017-09-06 ENCOUNTER — Ambulatory Visit: Attending: Cardiovascular Disease | Primary: Student in an Organized Health Care Education/Training Program

## 2017-09-06 DIAGNOSIS — I5022 Chronic systolic (congestive) heart failure: Secondary | ICD-10-CM

## 2017-09-06 MED ORDER — SPIRONOLACTONE 25 MG TAB
25 mg | ORAL_TABLET | Freq: Every day | ORAL | 0 refills | Status: DC
Start: 2017-09-06 — End: 2017-10-16

## 2017-09-06 NOTE — Progress Notes (Signed)
Identified pt with two pt identifiers(name and DOB). Reviewed record in preparation for visit and have obtained necessary documentation. All patient medications has been reviewed.  Chief Complaint   Patient presents with   ??? Establish Care       Health Maintenance Due   Topic   ??? Hepatitis C Screening    ??? Pneumococcal 0-64 years (1 of 1 - PPSV23)   ??? DTaP/Tdap/Td series (1 - Tdap)   ??? Shingrix Vaccine Age 59> (1 of 2)   ??? FOBT Q 1 YEAR AGE 59-75        Vitals:    09/06/17 1322   BP: 112/62   Pulse: 72   Resp: 18   Temp: 96.8 ??F (36 ??C)   TempSrc: Oral   SpO2: 97%   Weight: 152 lb 9.6 oz (69.2 kg)   Height: 5' 8" (1.727 m)   PainSc:   0 - No pain       Coordination of Care Questionnaire:   1) Have you been to an emergency room, urgent care, or hospitalized since your last visit?   no       2. Have seen or consulted any other health care provider since your last visit? NO    3) Do you have an Advanced Directive/ Living Will in place? NO  If yes, do we have a copy on file NO  If no, would you like information YES    Patient is accompanied by self I have received verbal consent from Cardale A Novelo to discuss any/all medical information while they are present in the room.

## 2017-09-06 NOTE — Addendum Note (Signed)
Addended by: Antony Contras on: 09/06/2017 05:39 PM     Modules accepted: Orders

## 2017-09-06 NOTE — Progress Notes (Addendum)
[facility]  [facility] in [place], [place]  Heart Failure Clinic Note    Patient name: Steve Green  Patient DOB: [birth year]  Patient MRN: [id number]  Date of service: 09/06/17    Primary care physician: None  Primary cardiologist: Dr. Lorella Nimrod     CHIEF COMPLAINT:  Chronic systolic heart failure    PLAN:  Continue current medical therapy for heart failure; uptitration limited by hypotension  Continue current dose of beta-blocker, toprol XL  daily  Continue current dose of lisinopril 2.5mg  daily  Start spironolactone 12.5mg  daily  Does not need diuretics, euvolemic  Continue amiodarone  daily, check thyroid profile every 3 months and PFT/DLCO every year   Chronic anticoagulation with eliquis  Continue ASA  No on statins  Continue lifevest  Schedule cardiac MRI with gadolinium  Schedule echocardiogram and EKG in 3 months from diagnosis, plan on July 2019  Labs: CBC, BMP, Mg, LFT, pro-NT-BNP, iron profile, thyroid profile, vitamin D level, lipid profile, CPK, SFC, SPEP/IF, HIV, hepatitis profile, coxackie antibodies, mycoplasma antibodies, Lyme's anitbodies  Reinforced low salt diet  Reinforced fluid restriction to 6 x 8oz glasses per day  Discussed abstinence from illicit drugs, commanded on tobacco abstinence  Counseled to obtain home scale and arm blood pressure cuff  Document in diary BP/HR before and 2 hours after taking medications and daily weights  Provided educational materials "Living with heart failure" and ACP forms  Follow-up with primary cardiologist  Follow-up with EP cardiologist  Establish and follow-up with PCP; recommend flu vaccine annually and pneumonia vaccine every 5 years  Okay to return to work with the following restrictions; light duty without heavy lifting > 10 lbs, no streneous exertion, frequent breaks and avoid heat  Return to AHF Clinic in two weeks with NP/MD    IMPRESSION:  Chronic systolic heart failure  Stage C, NYHA class I symptoms   Non-ischemic cardiomyopathy, 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 Probable tiny right pleural effusion.    HISTORY OF PRESENT ILLNESS:  I had the pleasure of seeing Steve Green in [facility] at [employer]. [facility] in [place].    Briefly, [name] is a 57 y.o. male with h/o non-ischemic cardiomyopathy LVEF 15-20% by echo 08/07/17), moderate MR and atrial flutter with RVR s/p DCCV currently on amiodarone and chronic anticoagulation with eliquis. Patient was seen by Dr. Irving Burton with EP cardiology and declined atrial flutter ablation, awaiting repeat echocardiogram in July 2019 to determine need for ICD.    INTERVAL HISTORY:  Today, patient presents for routine clinic visit.    Patient is doing very well.    Patient walked to our clinic from parking garage without having to slow down or stop. Patient can walk more than one block without symptoms of fatigue or shortness of breath. Patient can walk one flight of stairs without symptoms of fatigue or shortness of breath. Patient can perform home activities without problem and routinely participates in daily walking for more than 15 minutes.  Patient denies symptoms of volume overload or leg edema. Patient denies abdominal bloating or change of appetite. Patient's weight remained stable.     Patient denies orthopnea, PND or nocturia.    Patient denies irregular heart rate or palpitations. Lifevest has not fired.    Patient denies other cardiac symptoms such as chest pain or leg pain with  walking.     Patient is compliant with fluid restriction and taking medications as prescribed. Patient manages his own medications.     REVIEW OF SYSTEMS:  General: Denies fever, night sweats.  Ear, nose and throat: Denies difficulty hearing, sinus problems, runny nose, post-nasal drip, ringing in ears, mouth sores, loose teeth, ear pain, nosebleeds, sore throate, facial pain or numbess  Cardiovascular: see above in the interval history  Respiratory: Denies cough, wheezing, sputum production, hemoptysis.  Gastrointestinal: Denies heartburn, constipation, intolerance to certain foods, diarrhea, abdominal pain, nausea, vomiting, difficulty swallowing, blood in stool  Kidney and bladder: Denies painful urination, frequent urination, urgency, prostate problems and impotence  Musculoskeletal: Denies joint pain, muscle weakness  Skin and hair: Denies change in existing skin lesions, hair loss or increase, breast changes    PHYSICAL EXAM:  Vital signs:   Visit Vitals  BP 112/62 (BP 1 Location: Left arm, BP Patient Position: Sitting)   Pulse 72   Temp 96.8 ??F (36 ??C) (Oral)   Resp 18   Ht  (1.727 m)   Wt 152 lb 9.6 oz (69.2 kg)   SpO2 97%   BMI 23.20 kg/m??     General: Patient is well developed, well-nourished in no acute distress  HEENT: Normocephalic and atraumatic. No scleral icterus. Pupils are equal, round and reactive to light and accomodation. No conjunctival injection. Oropharynx is clear.   Neck: Supple. No evidence of thyroid enlargements or lymphadenopathy.  JVD: Cannot be appreciated   Lungs: Breath sounds are equal and clear bilaterally. No wheezes, rhonchi, or rales.  Heart: Regular rate and rhythm with normal S1 and S2. No murmurs, gallops or rubs.  Abdomen: Soft, no mass or tenderness. No organomegaly or hernia. Bowel sounds present.  Genitourinary and rectal: deferred  Extremities: No cyanosis, clubbing, or edema.  Neurologic: No focal sensory or motor deficits are noted. Grossly intact.   Psychiatric: Awake, alert an doriented x 3. Appropriate mood and affect.  Skin: Warm, dry and well perfused. No lesions, nodules or rashes are noted.    FAMILY HISTORY:  Family History   Problem Relation Age of Onset   ??? Heart Disease Other        SOCIAL HISTORY:  Social History     Socioeconomic History   ??? Marital status: SINGLE     Spouse name: Not on file   ??? Number of children: Not on file   ??? Years of education: Not on file   ??? Highest education level: Not on file   Tobacco Use   ??? Smoking status: Current Some Day Smoker     Packs/day: 0.50   ??? Smokeless tobacco: Never Used   Substance and Sexual Activity   ??? Alcohol use: Not Currently   ??? Drug use: Never   ??? Sexual activity: Yes     Partners: Female       LABORATORY RESULTS:     Labs Latest Ref Rng & Units 08/10/2017 08/09/2017 08/08/2017 08/07/2017 08/06/2017   WBC 4.1 - 11.1 K/uL - 10.7 4.1 6.3 6.2   RBC 4.10 - 5.70 M/uL - 3.93(L) 3.79(L) 4.03(L) 4.41  Hemoglobin 12.1 - 17.0 g/dL - 16.1 09.6 04.5 40.9   Hematocrit 36.6 - 50.3 % - 38.6 37.7 40.0 43.3   MCV 80.0 - 99.0 FL - 98.2 99.5(H) 99.3(H) 98.2   Platelets 150 - 400 K/uL - 164 156 175 181   Lymphocytes 12 - 49 % - 4(L) 9(L) - 36   Monocytes 5 - 13 % - 4(L) 3(L) - 11   Eosinophils 0 - 7 % - 0 0 - 1   Basophils 0 - 1 % - 0 0 - 1   Albumin 3.5 - 5.0 g/dL - 3.2(L) - - 3.3(L)   Calcium 8.5 - 10.1 MG/DL 8.5 8.7 8.1(L) 8.6 8.8   SGOT 15 - 37 U/L - 31 - - 52(H)   Glucose 65 - 100 mg/dL 811(B) 147(W) 295(A) 213(Y) 102(H)   BUN 6 - 20 MG/DL Creatinine 0.70 - 1.30 MG/DL 8.65 7.84 6.96 2.95 2.84   Sodium 136 - 145 mmol/L 137 137 139 140 142   Potassium 3.5 - 5.1 mmol/L 3.8 4.2 4.1 4.2 4.2   TSH 0.36 - 3.74 uIU/mL - - - - 1.22     Lab Results   Component Value Date/Time    TSH 1.22 08/06/2017 08:20 PM       CURRENT MEDICATIONS:    Current Outpatient Medications:   ???  apixaban (ELIQUIS) 5 mg tablet, Take 1 Tab by mouth two (2) times a day., Disp: 60 Tab, Rfl: 0   ???  amiodarone (CORDARONE) 200 mg tablet, Take 1 Tab by mouth daily., Disp: 30 Tab, Rfl: 0  ???  aspirin 81 mg chewable tablet, Take 1 Tab by mouth daily., Disp: 30 Tab, Rfl: 0  ???  lisinopril (PRINIVIL, ZESTRIL) 2.5 mg tablet, Take 1 Tab by mouth nightly., Disp: 30 Tab, Rfl: 0  ???  metoprolol succinate (TOPROL-XL) 25 mg XL tablet, Take 1 Tab by mouth daily., Disp: 30 Tab, Rfl: 0      Thank you for your referral and allowing me to participate in this patient's care.    Antony Contras, MD PhD  [facility]  [facility]  [address], [address]  Phone: [phone]  Fax: [phone]

## 2017-09-06 NOTE — Patient Instructions (Signed)
Start spironolactone 12.5mg  daily  Check labs next week  We will call you for cardiac MRI  Return to AHF Clinic in 2-3 weeks

## 2017-09-06 NOTE — Addendum Note (Signed)
Addendum Note by Antony Contras, MD at 09/06/17 1300                Author: Antony Contras, MD  Service: --  Author Type: Physician       Filed: 09/06/17 1739  Encounter Date: 09/06/2017  Status: Signed          Editor: Antony Contras, MD (Physician)          Addended by: Antony Contras on: 09/06/2017 05:39 PM    Modules accepted: Orders

## 2017-09-06 NOTE — Progress Notes (Signed)
 Identified pt with two pt identifiers(name and DOB). Reviewed record in preparation for visit and have obtained necessary documentation. All patient medications has been reviewed.  Chief Complaint   Patient presents with   . Establish Care       Health Maintenance Due   Topic   . Hepatitis C Screening    . Pneumococcal 0-64 years (1 of 1 - PPSV23)   . DTaP/Tdap/Td series (1 - Tdap)   . Shingrix Vaccine Age 59> (1 of 2)   . FOBT Q 1 YEAR AGE 42-75        Vitals:    09/06/17 1322   BP: 112/62   Pulse: 72   Resp: 18   Temp: 96.8 F (36 C)   TempSrc: Oral   SpO2: 97%   Weight: 152 lb 9.6 oz (69.2 kg)   Height: 5' 8 (1.727 m)   PainSc:   0 - No pain       Coordination of Care Questionnaire:   1) Have you been to an emergency room, urgent care, or hospitalized since your last visit?   no       2. Have seen or consulted any other health care provider since your last visit? NO    3) Do you have an Advanced Directive/ Living Will in place? NO  If yes, do we have a copy on file NO  If no, would you like information YES    Patient is accompanied by self I have received verbal consent from Steve Green to discuss any/all medical information while they are present in the room.

## 2017-09-06 NOTE — Progress Notes (Signed)
ADVANCED HE[facility] FAILURE CENTER  [address] [facility] in [place], [place]  Heart Failure Clinic Note    Patient name: Steve Green  Patient DOB: 1961-03-19  Patient MRN: [id number]  Date of service: 09/06/17    Primary care physician: None  Primary cardiologist: Dr. Lorella Nimrod     CHIEF COMPLAINT:  Chronic systolic heart failure    PLAN:  Continue current medical therapy for heart failure; uptitration limited by hypotension  Continue current dose of beta-blocker, toprol XL  daily  Continue current dose of lisinopril 2.5mg  daily  Start spironolactone 12.5mg  daily  Does not need diuretics, euvolemic  Continue amiodarone  daily, check thyroid profile every 3 months and PFT/DLCO every year   Chronic anticoagulation with eliquis  Continue ASA  No on statins  Continue lifevest  Schedule cardiac MRI with gadolinium  Schedule echocardiogram and EKG in 3 months from diagnosis, plan on July 2019  Labs: CBC, BMP, Mg, LFT, pro-NT-BNP, iron profile, thyroid profile, vitamin D level, lipid profile, CPK, SFC, SPEP/IF, HIV, hepatitis profile, coxackie antibodies, mycoplasma antibodies, Lyme's anitbodies  Reinforced low salt diet  Reinforced fluid restriction to 6 x 8oz glasses per day  Discussed abstinence from illicit drugs, commanded on tobacco abstinence  Counseled to obtain home scale and arm blood pressure cuff  Document in diary BP/HR before and 2 hours after taking medications and daily weights  Provided educational materials "Living with heart failure" and ACP forms  Follow-up with primary cardiologist  Follow-up with EP cardiologist  Establish and follow-up with PCP; recommend flu vaccine annually and pneumonia vaccine every 5 years  Okay to return to work with the following restrictions; light duty without heavy lifting > 10 lbs, no streneous exertion, frequent breaks and avoid heat  Return to AHF Clinic in two weeks with NP/MD    IMPRESSION:  Chronic systolic heart failure  Stage C, NYHA class I  symptoms  Non-ischemic cardiomyopathy, 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 Probable tiny right pleural effusion.    HISTORY OF PRESENT ILLNESS:  I had the pleasure of seeing Steve Green in Advanced [facility] Failure Clinic at [employer]. [facility] in [place].    Briefly, Steve Green is a 57 y.o. male with h/o non-ischemic cardiomyopathy LVEF 15-20% by echo 08/07/17), moderate MR and atrial flutter with RVR s/p DCCV currently on amiodarone and chronic anticoagulation with eliquis. Patient was seen by Dr. Irving Burton with EP cardiology and declined atrial flutter ablation, awaiting repeat echocardiogram in July 2019 to determine need for ICD.    INTERVAL HISTORY:  Today, patient presents for routine clinic visit.    Patient is doing very well.    Patient walked to our clinic from parking garage without having to slow down or stop. Patient can walk more than one block without symptoms of fatigue or shortness of breath. Patient can walk one flight of stairs without symptoms of fatigue or shortness of breath. Patient can perform home activities without problem and routinely participates in daily walking for more than 15 minutes.  Patient denies symptoms of volume overload or leg edema. Patient denies abdominal bloating or change of appetite. Patient's weight remained stable.     Patient denies orthopnea, PND or nocturia.    Patient denies irregular heart rate or palpitations. [employer] has not fired.    Patient denies other cardiac symptoms such as chest pain or leg  pain with walking.     Patient is compliant with fluid restriction and taking medications as prescribed. Patient manages his own medications.     REVIEW OF SYSTEMS:  General: Denies fever, night sweats.  Ear, nose and throat: Denies difficulty hearing, sinus problems, runny nose, post-nasal drip, ringing in ears, mouth sores, loose teeth, ear pain, nosebleeds, sore throate, facial pain or numbess  Cardiovascular: see above in the interval history  Respiratory: Denies cough, wheezing, sputum production, hemoptysis.  Gastrointestinal: Denies heartburn, constipation, intolerance to certain foods, diarrhea, abdominal pain, nausea, vomiting, difficulty swallowing, blood in stool  Kidney and bladder: Denies painful urination, frequent urination, urgency, prostate problems and impotence  Musculoskeletal: Denies joint pain, muscle weakness  Skin and hair: Denies change in existing skin lesions, hair loss or increase, breast changes    PHYSICAL EXAM:  Vital signs:   Visit Vitals  BP 112/62 (BP 1 Location: Left arm, BP Patient Position: Sitting)   Pulse 72   Temp 96.8 ??F (36 ??C) (Oral)   Resp 18   Ht  (1.727 m)   Wt 152 lb 9.6 oz (69.2 kg)   SpO2 97%   BMI 23.20 kg/m??     General: Patient is well developed, well-nourished in no acute distress  HEENT: Normocephalic and atraumatic. No scleral icterus. Pupils are equal, round and reactive to light and accomodation. No conjunctival injection. Oropharynx is clear.   Neck: Supple. No evidence of thyroid enlargements or lymphadenopathy.  JVD: Cannot be appreciated   Lungs: Breath sounds are equal and clear bilaterally. No wheezes, rhonchi, or rales.  Heart: Regular rate and rhythm with normal S1 and S2. No murmurs, gallops or rubs.  Abdomen: Soft, no mass or tenderness. No organomegaly or hernia. Bowel sounds present.  Genitourinary and rectal: deferred  Extremities: No cyanosis, clubbing, or edema.  Neurologic: No focal sensory or motor deficits are noted. Grossly  intact.  Psychiatric: Awake, alert an doriented x 3. Appropriate mood and affect.  Skin: Warm, dry and well perfused. No lesions, nodules or rashes are noted.    FAMILY HISTORY:  Family History   Problem Relation Age of Onset   ??? Heart Disease Other        SOCIAL HISTORY:  Social History     Socioeconomic History   ??? Marital status: SINGLE     Spouse name: Not on file   ??? Number of children: Not on file   ??? Years of education: Not on file   ??? Highest education level: Not on file   Tobacco Use   ??? Smoking status: Current Some Day Smoker     Packs/day: 0.50   ??? Smokeless tobacco: Never Used   Substance and Sexual Activity   ??? Alcohol use: Not Currently   ??? Drug use: Never   ??? Sexual activity: Yes     Partners: Female       LABORATORY RESULTS:     Labs Latest Ref Rng & Units 08/10/2017 08/09/2017 08/08/2017 08/07/2017 08/06/2017   WBC 4.1 - 11.1 K/uL - 10.7 4.1 6.3 6.2   RBC 4.10 - 5.70 M/uL - 3.93(L) 3.79(L) 4.03(L) 4.41  Hemoglobin 12.1 - 17.0 g/dL - 45.4 09.8 11.9 14.7   Hematocrit 36.6 - 50.3 % - 38.6 37.7 40.0 43.3   MCV 80.0 - 99.0 FL - 98.2 99.5(H) 99.3(H) 98.2   Platelets 150 - 400 K/uL - 164 156 175 181   Lymphocytes 12 - 49 % - 4(L) 9(L) - 36   Monocytes 5 - 13 % - 4(L) 3(L) - 11   Eosinophils 0 - 7 % - 0 0 - 1   Basophils 0 - 1 % - 0 0 - 1   Albumin 3.5 - 5.0 g/dL - 3.2(L) - - 3.3(L)   Calcium 8.5 - 10.1 MG/DL 8.5 8.7 8.1(L) 8.6 8.8   SGOT 15 - 37 U/L - 31 - - 52(H)   Glucose 65 - 100 mg/dL 829(F) 621(H) 086(V) 784(O) 102(H)   BUN 6 - 20 MG/DL Creatinine 0.70 - 1.30 MG/DL 9.62 9.52 8.41 3.24 4.01   Sodium 136 - 145 mmol/L 137 137 139 140 142   Potassium 3.5 - 5.1 mmol/L 3.8 4.2 4.1 4.2 4.2   TSH 0.36 - 3.74 uIU/mL - - - - 1.22     Lab Results   Component Value Date/Time    TSH 1.22 08/06/2017 08:20 PM       CURRENT MEDICATIONS:    Current Outpatient Medications:   ???  apixaban (ELIQUIS) 5 mg tablet, Take 1 Tab by mouth two (2) times a day., Disp: 60 Tab, Rfl: 0  ???  amiodarone (CORDARONE) 200 mg  tablet, Take 1 Tab by mouth daily., Disp: 30 Tab, Rfl: 0  ???  aspirin 81 mg chewable tablet, Take 1 Tab by mouth daily., Disp: 30 Tab, Rfl: 0  ???  lisinopril (PRINIVIL, ZESTRIL) 2.5 mg tablet, Take 1 Tab by mouth nightly., Disp: 30 Tab, Rfl: 0  ???  metoprolol succinate (TOPROL-XL) 25 mg XL tablet, Take 1 Tab by mouth daily., Disp: 30 Tab, Rfl: 0      Thank you for your referral and allowing me to participate in this patient's care.    Antony Contras, MD PhD  Advanced [facility] Failure Center  [facility]  [address], [address]  Phone: [phone]  Fax: [phone]

## 2017-09-13 ENCOUNTER — Encounter: Attending: Sports Medicine | Primary: Student in an Organized Health Care Education/Training Program

## 2017-09-19 NOTE — Telephone Encounter (Signed)
LVM for appt reminder

## 2017-09-20 ENCOUNTER — Encounter: Attending: Cardiovascular Disease | Primary: Student in an Organized Health Care Education/Training Program

## 2017-09-20 ENCOUNTER — Encounter

## 2017-09-20 NOTE — Telephone Encounter (Signed)
Left voice message for patient informing him that we need to reschedule his appointment for today, asking patient to call us back.

## 2017-09-28 NOTE — Telephone Encounter (Signed)
LVM for appt reminder

## 2017-10-01 ENCOUNTER — Ambulatory Visit
Admit: 2017-10-01 | Payer: PRIVATE HEALTH INSURANCE | Attending: Cardiovascular Disease | Primary: Student in an Organized Health Care Education/Training Program

## 2017-10-01 ENCOUNTER — Ambulatory Visit: Attending: Cardiovascular Disease | Primary: Student in an Organized Health Care Education/Training Program

## 2017-10-01 DIAGNOSIS — I5022 Chronic systolic (congestive) heart failure: Secondary | ICD-10-CM

## 2017-10-01 MED ORDER — LISINOPRIL 2.5 MG TAB
2.5 mg | ORAL_TABLET | Freq: Two times a day (BID) | ORAL | 0 refills | Status: DC
Start: 2017-10-01 — End: 2017-10-02

## 2017-10-01 MED ORDER — SPIRONOLACTONE 25 MG TAB
25 mg | ORAL_TABLET | Freq: Every day | ORAL | 0 refills | Status: DC
Start: 2017-10-01 — End: 2017-10-16

## 2017-10-01 NOTE — Patient Instructions (Addendum)
Increase lisinopril to 5mg  twice daily  Check labs today  Will call you to schedule cardiac MRI  Return to AHF Clinic in 6 weeks 2019

## 2017-10-01 NOTE — Progress Notes (Addendum)
ADVANCED HEART FAILURE CENTER  [address] [facility] in [place], [place]  Heart Failure Clinic Note    Patient name: Steve Green  Patient DOB: 1960/11/21  Patient MRN: [id number]  Date of service: 10/01/17    Primary care physician: None  Primary cardiologist: Dr. Lorella Nimrod   ??  CHIEF COMPLAINT:  Chronic systolic heart failure  ??  PLAN:  Continue current medical therapy for heart failure; uptitration limited by hypotension/bradycardia  Continue current dose of beta-blocker, toprol XL 25mg  daily; limited by bradycardia  Increase dose of lisinopril to 5mg  twice daily; check blood pressures at home  Continue spironolactone 12.5mg  daily; check labs next week  Does not need diuretics, euvolemic  Continue amiodarone 200mg  daily, check thyroid profile every 3 months and PFT/DLCO every year   Chronic anticoagulation with eliquis  Continue ASA  Patient is not sure why there is prednisone on his medication list; he does not have bottles with him; asked that patient calls [place] from home and check if he still takes steroids  Not on statins  Continue lifevest  Schedule cardiac MRI with gadolinium  12-lead EKG today to confirm sinus rhythm  Scheduled echocardiogram at [facility]. Thelma Barge mid-July 2019  Labs: CBC, BMP, Mg, LFT, pro-NT-BNP, iron profile, thyroid profile, vitamin D level, lipid profile, CPK, SFC, SPEP/IF, HIV, hepatitis profile, coxackie antibodies, Lyme's anitbodies  Reinforced low salt diet  Reinforced fluid restriction to 6 x 8oz glasses per day  Discussed abstinence from illicit drugs, commanded on tobacco abstinence  Counseled to obtain home scale and arm blood pressure cuff  Document in diary BP/HR before and 2 hours after taking medications and daily weights  Provided educational materials "Living with heart failure" and ACP forms  Follow-up with primary cardiologist, Dr. Lorella Nimrod  Follow-up with EP cardiologist  Establish and follow-up with PCP; recommend flu vaccine annually and  pneumonia vaccine every 5 years  Return to AHF Clinic in 6 weeks with NP/MD; will have 2 appointments with cardiology between now and then  ??  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  CARDIAC IMAGING:  Echo (08/07/17) LVEF 16-20%, moderate MR  EKG (08/14/17) NSR, non-specific ST-T changes, QRS 96ms  LHC (08/09/17) normal cors  ??  HEMODYNAMICS:  RHC (08/09/17) 12, 45/22/31, CO/CI 5.4/2.9  CPEST not done  not done  ??  OTHER IMAGING:  CXR (08/09/17) Unchanged cardiomegaly and mild pulmonary interstitial edema. Probable tiny right pleural effusion.  ??  HISTORY OF PRESENT ILLNESS:  I had the pleasure of seeing Steve Green in Advanced Heart Failure Clinic at [employer]. [facility] in [place].  ??  Briefly, Steve Green is a 57 y.o. male with h/o non-ischemic cardiomyopathy LVEF 15-20% (by echo 08/07/17), moderate MR and atrial flutter with RVR s/p DCCV currently on amiodarone and chronic anticoagulation with eliquis. Patient was seen by Dr. Irving Burton with EP cardiology and declined atrial flutter ablation, awaiting repeat echocardiogram in July 2019 to determine need for ICD. Patient did not receive a call yet with scheduled cMRI.  ??  INTERVAL HISTORY:  Today, patient presents for routine clinic visit.  ??  Patient is doing very well.  ??  Patient walked to our clinic from parking  garage without having to slow down or stop. Patient can walk more than one block without symptoms of fatigue or shortness of breath. Patient can walk one flight of stairs without symptoms of fatigue or shortness of breath. Patient can perform home activities without problem and routinely participates in daily  walking for more than 15 minutes.   ??  Patient denies symptoms of volume overload or leg edema. Patient denies abdominal bloating or change of appetite. Patient's weight remained stable.   ??  Patient denies orthopnea, PND or nocturia.    Patient denies irregular heart rate or palpitations. [employer] has not fired.    Patient denies other cardiac symptoms such as chest pain or leg pain with walking.     Patient is compliant with fluid restriction and taking medications as prescribed. Patient manages his own medications.     REVIEW OF SYSTEMS:  General: Denies fever, night sweats.  Ear, nose and throat: Denies difficulty hearing, sinus problems, runny nose, post-nasal drip, ringing in ears, mouth sores, loose teeth, ear pain, nosebleeds, sore throate, facial pain or numbess  Cardiovascular: see above in the interval history  Respiratory: Denies cough, wheezing, sputum production, hemoptysis.  Gastrointestinal: Denies heartburn, constipation, intolerance to certain foods, diarrhea, abdominal pain, nausea, vomiting, difficulty swallowing, blood in stool  Kidney and bladder: Denies painful urination, frequent urination, urgency, prostate problems and impotence  Musculoskeletal: Denies joint pain, muscle weakness  Skin and hair: Denies change in existing skin lesions, hair loss or increase, breast changes    PHYSICAL EXAM:  Vital signs:   Visit Vitals  BP 128/64 (BP 1 Location: Left arm, BP Patient Position: Sitting)   Pulse 68   Temp 97.6 ??F (36.4 ??C) (Oral)   Resp 20   Ht 5\' 8"  (1.727 m)   Wt 158 lb 12.8 oz (72 kg)   SpO2 97%   BMI 24.15 kg/m??     General: Patient is well developed, well-nourished in no acute distress  HEENT: Normocephalic and atraumatic. No scleral icterus. Pupils are equal, round and reactive to light and accomodation. No conjunctival injection. Oropharynx is clear.   Neck: Supple. No evidence of thyroid enlargements or lymphadenopathy.  JVD: Cannot be appreciated    Lungs: Breath sounds are equal and clear bilaterally. No wheezes, rhonchi, or rales.  Heart: Regular rate and rhythm with normal S1 and S2. No murmurs, gallops or rubs.  Abdomen: Soft, no mass or tenderness. No organomegaly or hernia. Bowel sounds present.  Genitourinary and rectal: deferred  Extremities: No cyanosis, clubbing, or edema.  Neurologic: No focal sensory or motor deficits are noted. Grossly intact.  Psychiatric: Awake, alert an doriented x 3. Appropriate mood and affect.  Skin: Warm, dry and well perfused. No lesions, nodules or rashes are noted.    FAMILY HISTORY:  Family History   Problem Relation Age of Onset   ??? Heart Disease Other        SOCIAL HISTORY:  Social History     Socioeconomic History   ??? Marital status: SINGLE     Spouse name: Not on file   ??? Number of children: Not on file   ??? Years of education: Not on file   ??? Highest education level: Not on file   Tobacco Use   ??? Smoking status: Former Smoker     Packs/day: 0.50     Last attempt to quit: 07/13/2017     Years since quitting: 0.2   ??? Smokeless tobacco: Never Used   Substance and Sexual  Activity   ??? Alcohol use: Not Currently   ??? Drug use: Never   ??? Sexual activity: Yes     Partners: Female     LABORATORY RESULTS:     Labs Latest Ref Rng & Units 08/10/2017 08/09/2017 08/08/2017 08/07/2017 08/06/2017   WBC 4.1 - 11.1 K/uL - 10.7 4.1 6.3 6.2   RBC 4.10 - 5.70 M/uL - 3.93(L) 3.79(L) 4.03(L) 4.41   Hemoglobin 12.1 - 17.0 g/dL - 96.0 45.4 09.8 11.9   Hematocrit 36.6 - 50.3 % - 38.6 37.7 40.0 43.3   MCV 80.0 - 99.0 FL - 98.2 99.5(H) 99.3(H) 98.2   Platelets 150 - 400 K/uL - 164 156 175 181   Lymphocytes 12 - 49 % - 4(L) 9(L) - 36   Monocytes 5 - 13 % - 4(L) 3(L) - 11   Eosinophils 0 - 7 % - 0 0 - 1   Basophils 0 - 1 % - 0 0 - 1   Albumin 3.5 - 5.0 g/dL - 3.2(L) - - 3.3(L)   Calcium 8.5 - 10.1 MG/DL 8.5 8.7 8.1(L) 8.6 8.8   SGOT 15 - 37 U/L - 31 - - 52(H)   Glucose 65 - 100 mg/dL 147(W) 295(A) 213(Y) 865(H) 102(H)   BUN 6 - 20 MG/DL 16 16 13 16 16     Creatinine 0.70 - 1.30 MG/DL 8.46 9.62 9.52 8.41 3.24   Sodium 136 - 145 mmol/L 137 137 139 140 142   Potassium 3.5 - 5.1 mmol/L 3.8 4.2 4.1 4.2 4.2   TSH 0.36 - 3.74 uIU/mL - - - - 1.22     Lab Results   Component Value Date/Time    TSH 1.22 08/06/2017 08:20 PM       CURRENT MEDICATIONS:    Current Outpatient Medications:   ???  predniSONE (DELTASONE) 20 mg tablet, Take 20 mg by mouth daily., Disp: , Rfl: 0  ???  spironolactone (ALDACTONE) 25 mg tablet, Take 0.5 Tabs by mouth daily., Disp: 30 Tab, Rfl: 0  ???  apixaban (ELIQUIS) 5 mg tablet, Take 1 Tab by mouth two (2) times a day., Disp: 60 Tab, Rfl: 0  ???  amiodarone (CORDARONE) 200 mg tablet, Take 1 Tab by mouth daily., Disp: 30 Tab, Rfl: 0  ???  aspirin 81 mg chewable tablet, Take 1 Tab by mouth daily., Disp: 30 Tab, Rfl: 0  ???  lisinopril (PRINIVIL, ZESTRIL) 2.5 mg tablet, Take 1 Tab by mouth nightly., Disp: 30 Tab, Rfl: 0  ???  metoprolol succinate (TOPROL-XL) 25 mg XL tablet, Take 1 Tab by mouth daily., Disp: 30 Tab, Rfl: 0    Thank you for your referral and allowing me to participate in this patient's care.    Antony Contras, MD PhD  Advanced Heart Failure Center  [facility]  [address], [address]  Phone: [phone]  Fax: [phone]

## 2017-10-01 NOTE — Progress Notes (Signed)
ADVANCED HE[facility] FAILURE CENTER  [facility] in [place], [place]  Heart Failure Clinic Note    Patient name: Steve Green  Patient DOB: 1961/03/21  Patient MRN: [id number]  Date of service: 10/01/17    Primary care physician: None  Primary cardiologist: Dr. Lorella Nimrod   ??  CHIEF COMPLAINT:  Chronic systolic heart failure  ??  PLAN:  Continue current medical therapy for heart failure; uptitration limited by hypotension/bradycardia  Continue current dose of beta-blocker, toprol XL 25mg  daily; limited by bradycardia  Increase dose of lisinopril to 5mg  twice daily; check blood pressures at home  Continue spironolactone 12.5mg  daily; check labs next week  Does not need diuretics, euvolemic  Continue amiodarone 200mg  daily, check thyroid profile every 3 months and PFT/DLCO every year   Chronic anticoagulation with eliquis  Continue ASA  Patient is not sure why there is prednisone on his medication list; he does not have bottles with him; asked that patient calls [place] from home and check if he still takes steroids  Not on statins  Continue lifevest  Schedule cardiac MRI with gadolinium  12-lead EKG today to confirm sinus rhythm  Scheduled echocardiogram at [facility]. Thelma Barge mid-July 2019  Labs: CBC, BMP, Mg, LFT, pro-NT-BNP, iron profile, thyroid profile, vitamin D level, lipid profile, CPK, SFC, SPEP/IF, HIV, hepatitis profile, coxackie antibodies, Lyme's anitbodies  Reinforced low salt diet  Reinforced fluid restriction to 6 x 8oz glasses per day  Discussed abstinence from illicit drugs, commanded on tobacco abstinence  Counseled to obtain home scale and arm blood pressure cuff  Document in diary BP/HR before and 2 hours after taking medications and daily weights  Provided educational materials "Living with heart failure" and ACP forms  Follow-up with primary cardiologist, Dr. Lorella Nimrod  Follow-up with EP cardiologist  Establish and follow-up with PCP; recommend flu vaccine annually and pneumonia vaccine every 5  years  Return to AHF Clinic in 6 weeks with NP/MD; will have 2 appointments with cardiology between now and then  ??  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  CARDIAC IMAGING:  Echo (08/07/17) LVEF 16-20%, moderate MR  EKG (08/14/17) NSR, non-specific ST-T changes, QRS 96ms  LHC (08/09/17) normal cors  ??  HEMODYNAMICS:  RHC (08/09/17) 12, 45/22/31, CO/CI 5.4/2.9  CPEST not done  not done  ??  OTHER IMAGING:  CXR (08/09/17) Unchanged cardiomegaly and mild pulmonary interstitial edema. Probable tiny right pleural effusion.  ??  HISTORY OF PRESENT ILLNESS:  I had the pleasure of seeing Steve Green in Advanced Heart Failure Clinic at [employer]. [facility] in [place].  ??  Briefly, Steve Green is a 57 y.o. male with h/o non-ischemic cardiomyopathy LVEF 15-20% (by echo 08/07/17), moderate MR and atrial flutter with RVR s/p DCCV currently on amiodarone and chronic anticoagulation with eliquis. Patient was seen by Dr. Irving Burton with EP cardiology and declined atrial flutter ablation, awaiting repeat echocardiogram in July 2019 to determine need for ICD. Patient did not receive a call yet with scheduled cMRI.  ??  INTERVAL HISTORY:  Today, patient presents for routine clinic visit.  ??  Patient is doing very well.  ??  Patient walked to our clinic from parking  garage without having to slow down or stop. Patient can walk more than one block without symptoms of fatigue or shortness of breath. Patient can walk one flight of stairs without symptoms of fatigue or shortness of breath. Patient can perform home activities without problem and routinely participates in daily walking for more than 15 minutes.   ??  Patient denies  symptoms of volume overload or leg edema. Patient denies abdominal bloating or change of appetite. Patient's weight remained stable.   ??  Patient denies orthopnea, PND or nocturia.    Patient denies irregular heart rate or palpitations. Lifevest has not fired.    Patient denies other cardiac symptoms such as chest pain or leg pain with walking.     Patient is compliant with fluid restriction and taking medications as prescribed. Patient manages his own medications.     REVIEW OF SYSTEMS:  General: Denies fever, night sweats.  Ear, nose and throat: Denies difficulty hearing, sinus problems, runny nose, post-nasal drip, ringing in ears, mouth sores, loose teeth, ear pain, nosebleeds, sore throate, facial pain or numbess  Cardiovascular: see above in the interval history  Respiratory: Denies cough, wheezing, sputum production, hemoptysis.  Gastrointestinal: Denies heartburn, constipation, intolerance to certain foods, diarrhea, abdominal pain, nausea, vomiting, difficulty swallowing, blood in stool  Kidney and bladder: Denies painful urination, frequent urination, urgency, prostate problems and impotence  Musculoskeletal: Denies joint pain, muscle weakness  Skin and hair: Denies change in existing skin lesions, hair loss or increase, breast changes    PHYSICAL EXAM:  Vital signs:   Visit Vitals  BP 128/64 (BP 1 Location: Left arm, BP Patient Position: Sitting)   Pulse 68   Temp 97.6 ??F (36.4 ??C) (Oral)   Resp 20   Ht 5\' 8"  (1.727 m)   Wt 158 lb 12.8 oz (72 kg)   SpO2 97%   BMI 24.15 kg/m??     General: Patient is well developed, well-nourished in no acute distress  HEENT: Normocephalic and atraumatic. No scleral icterus. Pupils are equal, round and reactive to light and accomodation. No conjunctival injection. Oropharynx is clear.   Neck: Supple. No evidence of thyroid enlargements or lymphadenopathy.  JVD: Cannot be appreciated   Lungs: Breath sounds are equal and clear bilaterally. No wheezes, rhonchi, or  rales.  Heart: Regular rate and rhythm with normal S1 and S2. No murmurs, gallops or rubs.  Abdomen: Soft, no mass or tenderness. No organomegaly or hernia. Bowel sounds present.  Genitourinary and rectal: deferred  Extremities: No cyanosis, clubbing, or edema.  Neurologic: No focal sensory or motor deficits are noted. Grossly intact.  Psychiatric: Awake, alert an doriented x 3. Appropriate mood and affect.  Skin: Warm, dry and well perfused. No lesions, nodules or rashes are noted.    FAMILY HISTORY:  Family History   Problem Relation Age of Onset   ??? Heart Disease Other        SOCIAL HISTORY:  Social History     Socioeconomic History   ??? Marital status: SINGLE     Spouse name: Not on file   ??? Number of children: Not on file   ??? Years of education: Not on file   ??? Highest education level: Not on file   Tobacco Use   ??? Smoking status: Former Smoker     Packs/day: 0.50     Last attempt to quit: 07/13/2017     Years since quitting: 0.2   ??? Smokeless tobacco: Never Used   Substance and Sexual  Activity   ??? Alcohol use: Not Currently   ??? Drug use: Never   ??? Sexual activity: Yes     Partners: Female     LABORATORY RESULTS:     Labs Latest Ref Rng & Units 08/10/2017 08/09/2017 08/08/2017 08/07/2017 08/06/2017   WBC 4.1 - 11.1 K/uL - 10.7 4.1 6.3 6.2   RBC 4.10 - 5.70 M/uL - 3.93(L) 3.79(L) 4.03(L) 4.41   Hemoglobin 12.1 - 17.0 g/dL - 21.313.0 08.612.8 57.813.7 46.914.6   Hematocrit 36.6 - 50.3 % - 38.6 37.7 40.0 43.3   MCV 80.0 - 99.0 FL - 98.2 99.5(H) 99.3(H) 98.2   Platelets 150 - 400 K/uL - 164 156 175 181   Lymphocytes 12 - 49 % - 4(L) 9(L) - 36   Monocytes 5 - 13 % - 4(L) 3(L) - 11   Eosinophils 0 - 7 % - 0 0 - 1   Basophils 0 - 1 % - 0 0 - 1   Albumin 3.5 - 5.0 g/dL - 3.2(L) - - 3.3(L)   Calcium 8.5 - 10.1 MG/DL 8.5 8.7 8.1(L) 8.6 8.8   SGOT 15 - 37 U/L - 31 - - 52(H)   Glucose 65 - 100 mg/dL 629(B101(H) 284(X138(H) 324(M157(H) 010(U105(H) 102(H)   BUN 6 - 20 MG/DL 16 16 13 16 16    Creatinine 0.70 - 1.30 MG/DL 7.251.01 3.660.97 4.400.91 3.470.91 4.251.06   Sodium 136 - 145 mmol/L  137 137 139 140 142   Potassium 3.5 - 5.1 mmol/L 3.8 4.2 4.1 4.2 4.2   TSH 0.36 - 3.74 uIU/mL - - - - 1.22     Lab Results   Component Value Date/Time    TSH 1.22 08/06/2017 08:20 PM       CURRENT MEDICATIONS:    Current Outpatient Medications:   ???  predniSONE (DELTASONE) 20 mg tablet, Take 20 mg by mouth daily., Disp: , Rfl: 0  ???  spironolactone (ALDACTONE) 25 mg tablet, Take 0.5 Tabs by mouth daily., Disp: 30 Tab, Rfl: 0  ???  apixaban (ELIQUIS) 5 mg tablet, Take 1 Tab by mouth two (2) times a day., Disp: 60 Tab, Rfl: 0  ???  amiodarone (CORDARONE) 200 mg tablet, Take 1 Tab by mouth daily., Disp: 30 Tab, Rfl: 0  ???  aspirin 81 mg chewable tablet, Take 1 Tab by mouth daily., Disp: 30 Tab, Rfl: 0  ???  lisinopril (PRINIVIL, ZESTRIL) 2.5 mg tablet, Take 1 Tab by mouth nightly., Disp: 30 Tab, Rfl: 0  ???  metoprolol succinate (TOPROL-XL) 25 mg XL tablet, Take 1 Tab by mouth daily., Disp: 30 Tab, Rfl: 0    Thank you for your referral and allowing me to participate in this patient's care.    Antony ContrasKatherine Shemica Meath, MD PhD  Advanced [facility] Failure Center  [facility]  [address], [address]  Phone: [phone]  Fax: [phone]

## 2017-10-02 MED ORDER — LISINOPRIL 5 MG TAB
5 mg | ORAL_TABLET | Freq: Two times a day (BID) | ORAL | 1 refills | Status: DC
Start: 2017-10-02 — End: 2017-10-16

## 2017-10-02 NOTE — Telephone Encounter (Signed)
Patient left voicemail regarding his medication-I called and left voicemail on home and mobile voicemail to return my call.

## 2017-10-08 ENCOUNTER — Encounter

## 2017-10-16 ENCOUNTER — Encounter

## 2017-10-16 ENCOUNTER — Encounter
Attending: Student in an Organized Health Care Education/Training Program | Primary: Student in an Organized Health Care Education/Training Program

## 2017-10-16 ENCOUNTER — Ambulatory Visit
Admit: 2017-10-16 | Discharge: 2017-10-16 | Payer: PRIVATE HEALTH INSURANCE | Attending: Specialist | Primary: Student in an Organized Health Care Education/Training Program

## 2017-10-16 ENCOUNTER — Ambulatory Visit
Admit: 2017-10-16 | Discharge: 2017-10-16 | Payer: PRIVATE HEALTH INSURANCE | Primary: Student in an Organized Health Care Education/Training Program

## 2017-10-16 ENCOUNTER — Ambulatory Visit: Attending: Specialist | Primary: Student in an Organized Health Care Education/Training Program

## 2017-10-16 ENCOUNTER — Ambulatory Visit

## 2017-10-16 DIAGNOSIS — I4891 Unspecified atrial fibrillation: Secondary | ICD-10-CM

## 2017-10-16 LAB — TRANSTHORACIC ECHOCARDIOGRAM (TTE) LIMITED (CONTRAST/BUBBLE/3D PRN)
EF BP: 39.1 % — AB (ref 55–100)
LA Area 4C: 22.3 cm2
LA Volume 2C: 72.9 mL — AB (ref 18–58)
LA Volume 4C: 63.85 mL — AB (ref 18–58)
LA Volume BP: 75.75 mL (ref 18–58)
LA Volume Index 2C: 39.43 ml/m2 (ref 16–28)
LA Volume Index 4C: 34.53 ml/m2 (ref 16–28)
LA Volume Index BP: 40.97 ml/m2 (ref 16–28)
LV EDV A2C: 172.7 mL
LV EDV A4C: 158.4 mL
LV EDV BP: 165 ml — AB (ref 67–155)
LV EDV Index A2C: 93.4 mL/m2
LV EDV Index A4C: 85.7 mL/m2
LV EDV Index BP: 89.2 mL/m2
LV ESV A2C: 112.9 mL
LV ESV A4C: 89.3 mL
LV ESV BP: 100.5 mL — AB (ref 22–58)
LV ESV Index A2C: 61.1 mL/m2
LV ESV Index A4C: 48.3 mL/m2
LV ESV Index BP: 54.4 mL/m2
LV Ejection Fraction A2C: 35 %
LV Ejection Fraction A4C: 44 %
Left Ventricular Ejection Fraction: 39
TAPSE: 2.39 cm — AB (ref 1.5–2)

## 2017-10-16 LAB — ECHO ADULT FOLLOW-UP OR LIMITED
EF BP: 39.1 % — AB (ref 55–100)
LA Area 4C: 22.3 cm2
LA Volume 2C: 72.9 mL — AB (ref 18–58)
LA Volume 4C: 63.85 mL — AB (ref 18–58)
LA Volume BP: 75.75 mL (ref 18–58)
LA Volume Index 2C: 39.43 ml/m2 (ref 16–28)
LA Volume Index 4C: 34.53 ml/m2 (ref 16–28)
LA Volume Index BP: 40.97 ml/m2 (ref 16–28)
LV EDV A2C: 172.7 mL
LV EDV A4C: 158.4 mL
LV EDV BP: 165 ml — AB (ref 67–155)
LV EDV Index A2C: 93.4 mL/m2
LV EDV Index A4C: 85.7 mL/m2
LV EDV Index BP: 89.2 mL/m2
LV ESV A2C: 112.9 mL
LV ESV A4C: 89.3 mL
LV ESV BP: 100.5 mL — AB (ref 22–58)
LV ESV Index A2C: 61.1 mL/m2
LV ESV Index A4C: 48.3 mL/m2
LV ESV Index BP: 54.4 mL/m2
LV Ejection Fraction A2C: 35 %
LV Ejection Fraction A4C: 44 %
TAPSE: 2.39 cm — AB (ref 1.5–2.0)

## 2017-10-16 NOTE — Progress Notes (Signed)
LAST OFFICE VISIT : Visit date not found         ICD-10-CM ICD-9-CM   1. Atrial fibrillation with RVR (HCC) I48.91 427.31   2. Screening cholesterol level Z13.220 V77.91            Steve Green is a 57 y.o. male with Aflutter, ICM referred for follow up.      Cardiac risk factors: male gender  I have personally obtained the history from the patient.    HISTORY OF PRESENTING ILLNESS     Overall the pt states he is doing well. Pt is still using the life vest. Pt states that he is exercising. Pt has not done cardiac rehab, but is not interested in it at this time. Pt has been going to HF clinic at [facility]. Pt states that he does not the the HF clinic has done a cardiac MRI. Pt drinks a lot of water.     The patient denies chest pain/ shortness of breath, orthopnea, PND, LE edema, palpitations, syncope, presyncope or fatigue.         ACTIVE PROBLEM LIST     Patient Active Problem List    Diagnosis Date Noted   ??? Systolic CHF, chronic ([place]) 08/07/2017   ??? Atrial fibrillation with RVR ([place]) 08/06/2017   ??? Chest pain 08/06/2017   ??? Hyperglycemia 08/06/2017   ??? Elevated troponin 08/06/2017           PAST MEDICAL HISTORY     No past medical history on file.        PAST SURGICAL HISTORY     No past surgical history on file.       ALLERGIES     No Known Allergies       FAMILY HISTORY     Family History   Problem Relation Age of Onset   ??? Heart Disease Other     negative for cardiac disease       SOCIAL HISTORY     Social History     Socioeconomic History   ??? Marital status: SINGLE     Spouse name: Not on file   ??? Number of children: Not on file   ??? Years of education: Not on file   ??? Highest education level: Not on file   Tobacco Use   ??? Smoking status: Former Smoker     Packs/day: 0.50     Last attempt to quit: 07/13/2017     Years since quitting: 0.2   ??? Smokeless tobacco: Never Used   Substance and Sexual Activity   ??? Alcohol use: Not Currently   ??? Drug use: Never   ??? Sexual activity: Yes     Partners: Female          MEDICATIONS     Current Outpatient Medications   Medication Sig   ??? lisinopril (PRINIVIL, ZESTRIL) 2.5 mg tablet Take  by mouth daily.   ??? apixaban (ELIQUIS) 5 mg tablet Take 1 Tab by mouth two (2) times a day.   ??? amiodarone (CORDARONE) 200 mg tablet Take 1 Tab by mouth daily.   ??? aspirin 81 mg chewable tablet Take 1 Tab by mouth daily.   ??? metoprolol succinate (TOPROL-XL) 25 mg XL tablet Take 1 Tab by mouth daily.     No current facility-administered medications for this visit.        I have reviewed the nurses notes, vitals, problem list, allergy list, medical history, family, social history and medications.  REVIEW OF SYMPTOMS      General: Pt denies excessive weight gain or loss. Pt is able to conduct ADL's  HEENT: Denies blurred vision, headaches, hearing loss, epistaxis and difficulty swallowing.  Respiratory: Denies cough, congestion, shortness of breath, DOE, wheezing or stridor.  Cardiovascular: Denies precordial pain, palpitations, edema or PND  Gastrointestinal: Denies poor appetite, indigestion, abdominal pain or blood in stool  Genitourinary: Denies hematuria, dysuria, increased urinary frequency  Musculoskeletal: Denies joint pain or swelling from muscles or joints  Neurologic: Denies tremor, paresthesias, headache, or sensory motor disturbance  Psychiatric: Denies confusion, insomnia, depression  Integumentray: Denies rash, itching or ulcers.  Hematologic: Denies easy bruising, bleeding     PHYSICAL EXAMINATION      Vitals:    10/16/17 1511   BP: 122/60   Pulse: 87   Weight: 158 lb (71.7 kg)   Height: '5\' 8"'  (1.727 m)     General: Well developed, in no acute distress.  HEENT: No jaundice, oral mucosa moist, no oral ulcers  Neck: Supple, no stiffness, no lymphadenopathy, supple  Heart:  Normal S1/S2 negative S3 or S4. Regular, no murmur, gallop or rub, no jugular venous distention  Respiratory: Clear bilaterally x 4, no wheezing or rales  Abdomen:   Soft, non-tender, bowel sounds are active.    Extremities:  No edema, normal cap refill, no cyanosis.  Musculoskeletal: No clubbing, no deformities  Neuro: A&Ox3, speech clear, gait stable, cooperative, no focal neurologic deficits  Skin: Skin color is normal. No rashes or lesions. Non diaphoretic, moist.  Vascular: 2+ pulses symmetric in all extremities         DIAGNOSTIC DATA     1. Cardiac Cath  08/09/17-  Normal Coronary artery anatomy??  PCWP 22??  C.O /C.I 5.69/3.06??  PA sat 73%    2. Echo  08/07/17-EF 16-20%, Severely dilated left ventricle, LAE mod, mild TV stenosis,   MV thickening. 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         LABORATORY DATA            Lab Results   Component Value Date/Time    WBC 10.7 08/09/2017 03:45 AM    HGB 13.0 08/09/2017 03:45 AM    HCT 38.6 08/09/2017 03:45 AM    PLATELET 164 08/09/2017 03:45 AM    MCV 98.2 08/09/2017 03:45 AM      Lab Results   Component Value Date/Time    Sodium 137 08/10/2017 04:09 AM    Potassium 3.8 08/10/2017 04:09 AM    Chloride 106 08/10/2017 04:09 AM    CO2 26 08/10/2017 04:09 AM    Anion gap 5 08/10/2017 04:09 AM    Glucose 101 (H) 08/10/2017 04:09 AM    BUN 16 08/10/2017 04:09 AM    Creatinine 1.01 08/10/2017 04:09 AM    BUN/Creatinine ratio 16 08/10/2017 04:09 AM    GFR est AA >60 08/10/2017 04:09 AM    GFR est non-AA >60 08/10/2017 04:09 AM    Calcium 8.5 08/10/2017 04:09 AM    Bilirubin, total 0.4 08/09/2017 03:45 AM    AST (SGOT) 31 08/09/2017 03:45 AM    Alk. phosphatase 120 (H) 08/09/2017 03:45 AM    Protein, total 6.3 (L) 08/09/2017 03:45 AM    Albumin 3.2 (L) 08/09/2017 03:45 AM    Globulin 3.1 08/09/2017 03:45 AM    A-G Ratio 1.0 (L) 08/09/2017 03:45 AM    ALT (SGPT) 75 08/09/2017 03:45 AM  ASSESSMENT/RECOMMENDATIONS:.      1. Aflutter  - He was cardioverted and was in SR on Amiodarone 200 mg daily. No bleeding on Eliquis at 5 mg. He is doing well with no further episodes of AF. He is currently wearing a life vest.   2. NICM   - His EF has improved up to 39% and is still wearing the life vest.  - HF clinic at [facility] is also following this  - Will ask Dr. Kathyrn Sheriff if he has to continue with the ICD  - He is NYHA 1   3. Screening cholesterol   - Failed to get his lipids checked. Will mail a lab slip to his house for him to have his cholesterol rechecked.   4. Return in 3 months or PRN.     Orders Placed This Encounter   ??? HEPATIC FUNCTION PANEL     Standing Status:   Future     Standing Expiration Date:   10/17/2018   ??? LIPID PANEL   ??? lisinopril (PRINIVIL, ZESTRIL) 2.5 mg tablet     Sig: Take  by mouth daily.              I have discussed the diagnosis with  Steve Green and the intended plan as seen in the above orders.  Questions were answered concerning future plans.  I have discussed medication side effects and warnings with the patient as well.    Thank you,  None for involving me in the care of  Steve Green. Please do not hesitate to contact me for further questions/concerns.     Written by Shela Nevin, [place], as dictated by Vennie Homans, MD.     Jeannette HowDoloresco,  MD, [facility]    Patient Care Team:  None as PCP - General  Vennie Homans, MD (Cardiology)    [facility]      [address] [place], [place]     [place], [place]      [phone] / [phone] Fax

## 2017-10-16 NOTE — Progress Notes (Signed)
Progress Notes by Vennie Homans, MD at 10/16/17 1500                Author: Vennie Homans, MD  Service: --  Author Type: Physician       Filed: 10/16/17 1623  Encounter Date: 10/16/2017  Status: Signed          Editor: Vennie Homans, MD (Physician)                       LAST OFFICE VISIT : Visit date not found                   ICD-10-CM  ICD-9-CM          1.  Atrial fibrillation with RVR (HCC)  I48.91  427.31          2.  Screening cholesterol level  Z13.220  V77.91                  Steve Green is a 57 y.o.  male with Aflutter, ICM referred for follow up.        Cardiac risk factors: male gender   I have personally obtained the history from the patient.        HISTORY OF PRESENTING ILLNESS        Overall the pt states he is doing well. Pt is still using the life vest. Pt states that he is exercising. Pt has not done cardiac rehab, but  is not interested in it at this time. Pt has been going to HF clinic at [facility]. Pt states that he does not the the HF clinic has done a cardiac MRI. Pt drinks a lot of water.       The patient denies chest pain/ shortness of breath, orthopnea, PND, LE edema, palpitations, syncope, presyncope or fatigue.              ACTIVE PROBLEM LIST          Patient Active Problem List           Diagnosis  Date Noted         ?  Systolic CHF, chronic ([place])  08/07/2017     ?  Atrial fibrillation with RVR ([place])  08/06/2017     ?  Chest pain  08/06/2017     ?  Hyperglycemia  08/06/2017         ?  Elevated troponin  08/06/2017                  PAST MEDICAL HISTORY        No past medical history on file.             PAST SURGICAL HISTORY        No past surgical history on file.            ALLERGIES        No Known Allergies            FAMILY HISTORY          Family History         Problem  Relation  Age of Onset          ?  Heart Disease  Other        negative for cardiac disease            SOCIAL HISTORY          Social History  Socioeconomic History         ?  Marital  status:  SINGLE              Spouse name:  Not on file         ?  Number of children:  Not on file     ?  Years of education:  Not on file     ?  Highest education level:  Not on file       Tobacco Use         ?  Smoking status:  Former Smoker              Packs/day:  0.50         Last attempt to quit:  07/13/2017         Years since quitting:  0.2         ?  Smokeless tobacco:  Never Used       Substance and Sexual Activity         ?  Alcohol use:  Not Currently     ?  Drug use:  Never     ?  Sexual activity:  Yes              Partners:  Female                MEDICATIONS          Current Outpatient Medications        Medication  Sig         ?  lisinopril (PRINIVIL, ZESTRIL) 2.5 mg tablet  Take  by mouth daily.     ?  apixaban (ELIQUIS) 5 mg tablet  Take 1 Tab by mouth two (2) times a day.     ?  amiodarone (CORDARONE) 200 mg tablet  Take 1 Tab by mouth daily.     ?  aspirin 81 mg chewable tablet  Take 1 Tab by mouth daily.         ?  metoprolol succinate (TOPROL-XL) 25 mg XL tablet  Take 1 Tab by mouth daily.          No current facility-administered medications for this visit.            I have reviewed the nurses notes, vitals, problem list, allergy list, medical history, family, social history and medications.            REVIEW OF SYMPTOMS         General: Pt denies excessive weight gain or loss. Pt is able to conduct ADL's   HEENT: Denies blurred vision, headaches, hearing loss, epistaxis and difficulty swallowing.   Respiratory: Denies cough, congestion, shortness of breath, DOE, wheezing or stridor.   Cardiovascular: Denies precordial pain, palpitations, edema or PND   Gastrointestinal: Denies poor appetite, indigestion, abdominal pain or blood in stool   Genitourinary: Denies hematuria, dysuria, increased urinary frequency   Musculoskeletal: Denies joint pain or swelling from muscles or joints   Neurologic: Denies tremor, paresthesias, headache, or sensory motor disturbance   Psychiatric: Denies confusion,  insomnia, depression   Integumentray: Denies rash, itching or ulcers.   Hematologic: Denies easy bruising, bleeding         PHYSICAL EXAMINATION           Vitals:          10/16/17 1511        BP:  122/60     Pulse:  87  Weight:  158 lb (71.7 kg)        Height:  5' 8" (1.727 m)        General: Well developed, in no acute distress.   HEENT: No jaundice, oral mucosa moist, no oral ulcers   Neck: Supple, no stiffness, no lymphadenopathy, supple   Heart:  Normal S1/S2 negative S3 or S4. Regular, no murmur, gallop or rub, no jugular venous distention   Respiratory: Clear bilaterally x 4, no wheezing or rales   Abdomen:   Soft, non-tender, bowel sounds are active.    Extremities:  No edema, normal cap refill, no cyanosis.   Musculoskeletal: No clubbing, no deformities   Neuro: A&Ox3, speech clear, gait stable, cooperative, no focal neurologic deficits   Skin: Skin color is normal. No rashes or lesions. Non diaphoretic, moist.   Vascular: 2+ pulses symmetric in all extremities               DIAGNOSTIC DATA        1. Cardiac Cath   08/09/17-   Normal Coronary artery anatomy??   PCWP 22??   C.O /C.I 5.69/3.06??   PA sat 73%      2. Echo   08/07/17-EF 16-20%, Severely dilated left ventricle, LAE mod, mild TV stenosis,    MV thickening. 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               LABORATORY DATA                    Lab Results         Component  Value  Date/Time            WBC  10.7  08/09/2017 03:45 AM       HGB  13.0  08/09/2017 03:45 AM       HCT  38.6  08/09/2017 03:45 AM       PLATELET  164  08/09/2017 03:45 AM            MCV  98.2  08/09/2017 03:45 AM           Lab Results         Component  Value  Date/Time            Sodium  137  08/10/2017 04:09 AM       Potassium  3.8  08/10/2017 04:09 AM       Chloride  106  08/10/2017 04:09 AM       CO2  26  08/10/2017 04:09 AM       Anion gap  5  08/10/2017 04:09 AM       Glucose  101 (H)  08/10/2017 04:09 AM       BUN  16  08/10/2017 04:09 AM       Creatinine  1.01   08/10/2017 04:09 AM       BUN/Creatinine ratio  16  08/10/2017 04:09 AM       GFR est AA  >60  08/10/2017 04:09 AM       GFR est non-AA  >60  08/10/2017 04:09 AM       Calcium  8.5  08/10/2017 04:09 AM       Bilirubin, total  0.4  08/09/2017 03:45 AM       AST (SGOT)  31  08/09/2017 03:45 AM       Alk. phosphatase  120 (H)  08/09/2017 03:45 AM       Protein, total  6.3 (L)  08/09/2017 03:45 AM       Albumin  3.2 (L)  08/09/2017 03:45 AM       Globulin  3.1  08/09/2017 03:45 AM       A-G Ratio  1.0 (L)  08/09/2017 03:45 AM            ALT (SGPT)  75  08/09/2017 03:45 AM                  ASSESSMENT/RECOMMENDATIONS:.         1. Aflutter   - He was cardioverted and was in SR on Amiodarone 200 mg daily. No bleeding on Eliquis at 5 mg. He is doing well with no further episodes of AF. He is currently wearing a life vest.    2. NICM   - His EF has improved up to 39% and is still wearing the life vest.   - HF clinic at [facility] is also following this   - Will ask Dr. Kathyrn Sheriff if he has to continue with the ICD   - He is NYHA 1    3. Screening cholesterol    - Failed to get his lipids checked. Will mail a lab slip to his house for him to have his cholesterol rechecked.    4. Return in 3 months or PRN.         Orders Placed This Encounter        ?  HEPATIC FUNCTION PANEL              Standing Status:    Future         Standing Expiration Date:    10/17/2018        ?  LIPID PANEL     ?  lisinopril (PRINIVIL, ZESTRIL) 2.5 mg tablet             Sig: Take  by mouth daily.                       I have discussed the diagnosis with  [name] and the intended plan  as seen in the above orders.  Questions were answered concerning future plans.  I have discussed medication side effects and warnings with the patient as well.      Thank you,  None for involving me in the care of  [name] . Please do not hesitate to contact me for further questions/concerns.       Written by Shela Nevin, [place], as dictated by Vennie Homans,  MD.       Jeannette HowDoloresco,  MD, [facility]      Patient Care Team:   None as PCP - General   Vennie Homans, MD (Cardiology)      [facility]       [address] [place], [place]      [place], [place]       [phone] / [phone] Fax

## 2017-10-22 ENCOUNTER — Encounter
Attending: Student in an Organized Health Care Education/Training Program | Primary: Student in an Organized Health Care Education/Training Program

## 2017-10-22 NOTE — Progress Notes (Deleted)
Steve Green is a 57 y.o.M who is here to establish care    Establish care    PMhx significant for A-fib on AC (Eliquis), HFrEF w/ dilated LA and RA, HTN.    Afib  - currently on Eliquid 5mg  BID  - Cardiologist  -         Current Outpatient Medications   Medication Sig Dispense Refill   ??? lisinopril (PRINIVIL, ZESTRIL) 2.5 mg tablet Take  by mouth daily.     ??? apixaban (ELIQUIS) 5 mg tablet Take 1 Tab by mouth two (2) times a day. 60 Tab 0   ??? amiodarone (CORDARONE) 200 mg tablet Take 1 Tab by mouth daily. 30 Tab 0   ??? aspirin 81 mg chewable tablet Take 1 Tab by mouth daily. 30 Tab 0   ??? metoprolol succinate (TOPROL-XL) 25 mg XL tablet Take 1 Tab by mouth daily. 30 Tab 0     Allergies: Patient has no known allergies.   Social History     Socioeconomic History   ??? Marital status: SINGLE     Spouse name: Not on file   ??? Number of children: Not on file   ??? Years of education: Not on file   ??? Highest education level: Not on file   Occupational History   ??? Not on file   Social Needs   ??? Financial resource strain: Not on file   ??? Food insecurity:     Worry: Not on file     Inability: Not on file   ??? Transportation needs:     Medical: Not on file     Non-medical: Not on file   Tobacco Use   ??? Smoking status: Former Smoker     Packs/day: 0.50     Last attempt to quit: 07/13/2017     Years since quitting: 0.2   ??? Smokeless tobacco: Never Used   Substance and Sexual Activity   ??? Alcohol use: Not Currently   ??? Drug use: Never   ??? Sexual activity: Yes     Partners: Female   Lifestyle   ??? Physical activity:     Days per week: Not on file     Minutes per session: Not on file   ??? Stress: Not on file   Relationships   ??? Social connections:     Talks on phone: Not on file     Gets together: Not on file     Attends religious service: Not on file     Active member of club or organization: Not on file     Attends meetings of clubs or organizations: Not on file     Relationship status: Not on file   ??? Intimate partner violence:      Fear of current or ex partner: Not on file     Emotionally abused: Not on file     Physically abused: Not on file     Forced sexual activity: Not on file   Other Topics Concern   ??? Not on file   Social History Narrative   ??? Not on file     Family History   Problem Relation Age of Onset   ??? Heart Disease Other      Patient Active Problem List   Diagnosis Code   ??? Atrial fibrillation with RVR (HCC) I48.91   ??? Chest pain R07.9   ??? Hyperglycemia R73.9   ??? Elevated troponin R74.8   ??? Systolic CHF, chronic (HCC) I50.22

## 2017-10-31 ENCOUNTER — Encounter: Attending: Nurse Practitioner | Primary: Student in an Organized Health Care Education/Training Program

## 2017-10-31 NOTE — Progress Notes (Deleted)
HISTORY OF PRESENTING ILLNESS      Steve Green is a 57 y.o. male with nonischemic cardiomyopathy, LVEF 15-20% (echocardiogram 08/07/17), moderate MR, atrial flutter s/p DCCV (on amiodarone) referred to discuss atrial flutter and ICD therapy for primary prevention of sudden death. He is currently on guideline directed medical therapy. He is on eliquis for CVA risk reduction.     Discussed atrial flutter ablation; he wishes to defer at this time. Repeat echocardiogram in 2 months and if EF < 35%, plan for single chamber ICD implantation for primary prevention of sudden death.   ??   ACTIVE PROBLEM LIST     Patient Active Problem List    Diagnosis Date Noted   ??? Systolic CHF, chronic ([place]) 08/07/2017   ??? Atrial fibrillation with RVR ([place]) 08/06/2017   ??? Chest pain 08/06/2017   ??? Hyperglycemia 08/06/2017   ??? Elevated troponin 08/06/2017           PAST MEDICAL HISTORY     No past medical history on file.        PAST SURGICAL HISTORY     No past surgical history on file.       ALLERGIES     No Known Allergies       FAMILY HISTORY     Family History   Problem Relation Age of Onset   ??? Heart Disease Other     negative for cardiac disease       SOCIAL HISTORY     Social History     Socioeconomic History   ??? Marital status: SINGLE     Spouse name: Not on file   ??? Number of children: Not on file   ??? Years of education: Not on file   ??? Highest education level: Not on file   Tobacco Use   ??? Smoking status: Former Smoker     Packs/day: 0.50     Last attempt to quit: 07/13/2017     Years since quitting: 0.3   ??? Smokeless tobacco: Never Used   Substance and Sexual Activity   ??? Alcohol use: Not Currently   ??? Drug use: Never   ??? Sexual activity: Yes     Partners: Female         MEDICATIONS     Current Outpatient Medications   Medication Sig   ??? lisinopril (PRINIVIL, ZESTRIL) 2.5 mg tablet Take  by mouth daily.   ??? apixaban (ELIQUIS) 5 mg tablet Take 1 Tab by mouth two (2) times a day.    ??? amiodarone (CORDARONE) 200 mg tablet Take 1 Tab by mouth daily.   ??? aspirin 81 mg chewable tablet Take 1 Tab by mouth daily.   ??? metoprolol succinate (TOPROL-XL) 25 mg XL tablet Take 1 Tab by mouth daily.     No current facility-administered medications for this visit.        I have reviewed the nurses notes, vitals, problem list, allergy list, medical history, family, social history and medications.       REVIEW OF SYMPTOMS      General: Pt denies excessive weight gain or loss. Pt is able to conduct ADL's  HEENT: Denies blurred vision, headaches, hearing loss, epistaxis and difficulty swallowing.  Respiratory: Denies cough, congestion, shortness of breath, DOE, wheezing or stridor.  Cardiovascular: Denies precordial pain, palpitations, edema or PND  Gastrointestinal: Denies poor appetite, indigestion, abdominal pain or blood in stool  Genitourinary: Denies hematuria, dysuria, increased urinary frequency  Musculoskeletal: Denies joint pain  or swelling from muscles or joints  Neurologic: Denies tremor, paresthesias, headache, or sensory motor disturbance  Psychiatric: Denies confusion, insomnia, depression  Integumentray: Denies rash, itching or ulcers.  Hematologic: Denies easy bruising, bleeding       PHYSICAL EXAMINATION      There were no vitals filed for this visit.  General: Well developed, in no acute distress.  HEENT: No jaundice, oral mucosa moist, no oral ulcers  Neck: Supple, no stiffness, no lymphadenopathy, supple  Heart: ??Normal S1/S2 negative S3 or S4. Regular, no murmur, gallop or rub, no jugular venous distention  Respiratory: Clear bilaterally x 4, no wheezing or rales  Abdomen:?? ??Soft, non-tender, bowel sounds are active.??  Extremities:  No edema, normal cap refill, no cyanosis.  Musculoskeletal: No clubbing, no deformities  Neuro: A&Ox3, speech clear, gait stable, cooperative, no focal neurologic deficits  Skin: Skin color is normal. No rashes or lesions. Non diaphoretic, moist.   Vascular: 2+ pulses symmetric in all extremities       DIAGNOSTIC DATA      EKG:        LABORATORY DATA      Lab Results   Component Value Date/Time    WBC 10.7 08/09/2017 03:45 AM    HGB 13.0 08/09/2017 03:45 AM    HCT 38.6 08/09/2017 03:45 AM    PLATELET 164 08/09/2017 03:45 AM    MCV 98.2 08/09/2017 03:45 AM      Lab Results   Component Value Date/Time    Sodium 137 08/10/2017 04:09 AM    Potassium 3.8 08/10/2017 04:09 AM    Chloride 106 08/10/2017 04:09 AM    CO2 26 08/10/2017 04:09 AM    Anion gap 5 08/10/2017 04:09 AM    Glucose 101 (H) 08/10/2017 04:09 AM    BUN 16 08/10/2017 04:09 AM    Creatinine 1.01 08/10/2017 04:09 AM    BUN/Creatinine ratio 16 08/10/2017 04:09 AM    GFR est AA >60 08/10/2017 04:09 AM    GFR est non-AA >60 08/10/2017 04:09 AM    Calcium 8.5 08/10/2017 04:09 AM    Bilirubin, total 0.4 08/09/2017 03:45 AM    AST (SGOT) 31 08/09/2017 03:45 AM    Alk. phosphatase 120 (H) 08/09/2017 03:45 AM    Protein, total 6.3 (L) 08/09/2017 03:45 AM    Albumin 3.2 (L) 08/09/2017 03:45 AM    Globulin 3.1 08/09/2017 03:45 AM    A-G Ratio 1.0 (L) 08/09/2017 03:45 AM    ALT (SGPT) 75 08/09/2017 03:45 AM           ASSESSMENT      1. Cardiomyopathy              A. NICM              B. LVEF 20-25%              C. NYHA II              D. QRS duration 96 msec  2. Atrial flutter              A. DCCV              B. Amiodarone  3. Mitral regurgitation              A. Moderate  ??     PLAN          FOLLOW-UP       Thank you, Dr. Mort Sawyers for allowing me to participate  in the care of this extraordinarily pleasant male. Please do not hesitate to contact me for further questions/concerns.         Candis Schatz, MD  Cardiac Electrophysiology / Cardiology    [facility]  [address], [address], [address]  [place], [place]    [place], [place]   [phone] / [phone] Fax   [phone] / [phone] Fax

## 2017-11-05 ENCOUNTER — Telehealth

## 2017-11-05 NOTE — Telephone Encounter (Signed)
-----   Message from Rushie ChestnutMark Doloresco, MD sent at 11/01/2017  2:59 PM EDT -----  PLEASE ORDER A CARDIAC mri ON THIS GUY  ----- Message -----  From: Antony ContrasLietz, Katherine, MD  Sent: 10/23/2017   3:56 PM EDT  To: Rushie ChestnutMark Doloresco, MD    Sorry for belated response, yes, please if possible.     ----- Message -----  From: Rushie Chestnutoloresco, Mark, MD  Sent: 10/16/2017   3:52 PM  To: Antony ContrasKatherine Lietz, MD    EF is now up to 39% and ? Need cardiac MRI.?

## 2017-11-05 NOTE — Telephone Encounter (Signed)
Ordered MRI

## 2017-11-07 NOTE — Telephone Encounter (Signed)
Confirmed appt with patient

## 2017-11-08 ENCOUNTER — Encounter: Attending: Cardiovascular Disease | Primary: Student in an Organized Health Care Education/Training Program

## 2017-11-08 NOTE — Progress Notes (Deleted)
AD[facility]ANC[facility]  [facility] in [place], [place]  Heart Failure Clinic Note    Patient name: Steve Green  Patient DOB: 1960/05/06  Patient MRN: [id number]  Date of service: 11/08/17    Primary care physician:??None  Primary cardiologist:??Dr. Doloresco??  ??  CHIEF COMPLAINT:  Chronic systolic heart failure  ??  PLAN:  Continue current medical therapy for heart failure; uptitration limited by hypotension/bradycardia  Continue current dose of beta-blocker, toprol??XL 25mg  daily; limited by bradycardia  Increase dose of??lisinopril to 5mg  twice daily; check blood pressures at home  Continue??spironolactone 12.5mg  daily; check labs next week  Does not need diuretics, euvolemic  Continue amiodarone??200mg  daily, check thyroid profile every 3 months and PFT/DLCO every year   Chronic anticoagulation with??eliquis  Continue ASA  Patient is not sure why there is prednisone on his medication list; he does not have bottles with him; asked that patient calls [place] from home and check if he still takes steroids  Not on statins  Continue lifevest  Schedule cardiac MRI with gadolinium to r/o amyloidosis (elevated troponins and proNT BNP with near recovered LVEF)  12-lead EKG today to confirm sinus rhythm  Scheduled echocardiogram at [facility]. Thelma Barge mid-July 2019  Labs: CBC, BMP,??Mg,??LFT, pro-NT-BNP, iron profile, thyroid profile, vitamin D level, lipid profile, CPK, SFC, SPEP/IF,??HIV, hepatitis profile, coxackie antibodies, Lyme's anitbodies, gammapathy profile, aTTR  Reinforced low salt diet  Reinforced fluid restriction to 6 x 8oz glasses per day  Discussed abstinence from illicit drugs, commanded on tobacco abstinence  Counseled to obtain home scale and arm blood pressure cuff  Document in diary BP/HR before and 2 hours after taking medications and daily weights  Provided educational materials "Living with heart failure" and ACP??forms  Follow-up with primary cardiologist, Dr. Lorella Nimrod   Follow-up with EP cardiologist  Establish and follow-up with PCP; recommend flu vaccine annually and pneumonia vaccine every 5 years  Return to AHF Clinic in??6 weeks??with NP/MD; will have 2 appointments with cardiology between now and then  ??  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  CARDIAC IMAGING:  Echo (10/16/17) LVEF 39%, RV not well visualized  Echo??(08/07/17) LVEF 16-20%, moderate MR  EKG??(08/14/17) NSR, non-specific ST-T changes, QRS 96ms, lateral leads T wave inversion  LHC??(08/09/17) normal cors  ??  HEMODYNAMICS:  RHC??(08/09/17) 12, 45/22/31, CO/CI 5.4/2.9  CPEST not done  not done  ??  OTHER IMAGING:  CXR??(08/09/17)??Unchanged cardiomegaly and mild pulmonary interstitial edema. Probable tiny right pleural effusion.  ??  HISTORY OF PRESENT ILLNESS:  I had the pleasure of seeing??Steve Green??in Advanced [facility] Failure Clinic at [employer]. [facility] in [place].  ??  Briefly,??Steve Green??is a 57 y.o.??male??with h/o non-ischemic cardiomyopathy LVEF 15-20% (by echo 08/07/17), moderate MR and atrial flutter with RVR s/p DCCV currently on amiodarone and chronic anticoagulation with eliquis. Patient was seen by Dr. Irving Burton with EP cardiology and declined atrial flutter ablation, awaiting repeat echocardiogram in July 2019 to determine need for ICD. Patient did not receive a call yet with scheduled cMRI.  ??  INTERVAL HISTORY:  Today, patient presents for routine clinic visit.  ??  Patient is doing very well.  ??  Patient walked to our  clinic from parking garage without having to slow down or stop. Patient can walk more than one block without symptoms of  fatigue or shortness of breath. Patient can walk one flight of stairs without symptoms of fatigue or shortness of breath. Patient can perform home activities without problem and routinely participates in daily walking for more than 15 minutes.     Patient denies symptoms of volume overload or leg edema. Patient denies abdominal bloating or change of appetite. Patient's weight remained stable.     Patient denies orthopnea, PND or nocturia.    Patient denies irregular heart rate or palpitations. ICD has not fired.    Patient denies other cardiac symptoms such as chest pain or leg pain with walking.     Patient is compliant with fluid restriction and taking medications as prescribed. Patient manages his own medications.     REVIEW OF SYSTEMS:  General: Denies fever, night sweats.  Ear, nose and throat: Denies difficulty hearing, sinus problems, runny nose, post-nasal drip, ringing in ears, mouth sores, loose teeth, ear pain, nosebleeds, sore throate, facial pain or numbess  Cardiovascular: see above in the interval history  Respiratory: Denies cough, wheezing, sputum production, hemoptysis.  Gastrointestinal: Denies heartburn, constipation, intolerance to certain foods, diarrhea, abdominal pain, nausea, vomiting, difficulty swallowing, blood in stool  Kidney and bladder: Denies painful urination, frequent urination, urgency, prostate problems and impotence  Musculoskeletal: Denies joint pain, muscle weakness  Skin and hair: Denies change in existing skin lesions, hair loss or increase, breast changes    PHYSICAL EXAM:  There were no vitals taken for this visit.  General: Patient is well developed, well-nourished in no acute distress  HEENT: Normocephalic and atraumatic. No scleral icterus. Pupils are equal, round and reactive to light and accomodation. No conjunctival injection. Oropharynx is clear.   Neck: Supple. No evidence of thyroid enlargements or lymphadenopathy.  JVD: Cannot be appreciated    Lungs: Breath sounds are equal and clear bilaterally. No wheezes, rhonchi, or rales.  Heart: Regular rate and rhythm with normal S1 and S2. No murmurs, gallops or rubs.  Abdomen: Soft, no mass or tenderness. No organomegaly or hernia. Bowel sounds present.  Genitourinary and rectal: deferred  Extremities: No cyanosis, clubbing, or edema.  Neurologic: No focal sensory or motor deficits are noted. Grossly intact.  Psychiatric: Awake, alert an doriented x 3. Appropriate mood and affect.  Skin: Warm, dry and well perfused. No lesions, nodules or rashes are noted.    PAST MEDICAL HISTORY:  No past medical history on file.    PAST SURGICAL HISTORY:  No past surgical history on file.    FAMILY HISTORY:  Family History   Problem Relation Age of Onset   ??? Heart Disease Other        SOCIAL HISTORY:  Social History     Socioeconomic History   ??? Marital status: SINGLE     Spouse name: Not on file   ??? Number of children: Not on file   ??? Years of education: Not on file   ??? Highest education level: Not on file   Tobacco Use   ??? Smoking status: Former Smoker     Packs/day: 0.50     Last attempt to quit: 07/13/2017     Years since quitting: 0.3   ??? Smokeless tobacco: Never Used   Substance and Sexual Activity   ??? Alcohol use: Not Currently   ??? Drug use: Never   ??? Sexual activity: Yes     Partners:  Female       LABORATORY RESULTS:   No flowsheet data found.  Lab Results   Component Value Date/Time    TSH 1.22 08/06/2017 08:20 PM       CURRENT MEDICATIONS:    Current Outpatient Medications:   ???  lisinopril (PRINIVIL, ZESTRIL) 2.5 mg tablet, Take  by mouth daily., Disp: , Rfl:   ???  apixaban (ELIQUIS) 5 mg tablet, Take 1 Tab by mouth two (2) times a day., Disp: 60 Tab, Rfl: 0  ???  amiodarone (CORDARONE) 200 mg tablet, Take 1 Tab by mouth daily., Disp: 30 Tab, Rfl: 0  ???  aspirin 81 mg chewable tablet, Take 1 Tab by mouth daily., Disp: 30 Tab, Rfl: 0  ???  metoprolol succinate (TOPROL-XL) 25 mg XL tablet, Take 1 Tab by mouth  daily., Disp: 30 Tab, Rfl: 0      Thank you for your referral and allowing me to participate in this patient's care.    Antony Contras, MD PhD  [facility] Failure Center  [facility]  [address]  Phone: [phone]  Fax: [phone]

## 2017-11-16 ENCOUNTER — Ambulatory Visit
Admit: 2017-11-16 | Discharge: 2017-11-16 | Payer: PRIVATE HEALTH INSURANCE | Attending: Nurse Practitioner | Primary: Student in an Organized Health Care Education/Training Program

## 2017-11-16 ENCOUNTER — Ambulatory Visit: Attending: Nurse Practitioner | Primary: Student in an Organized Health Care Education/Training Program

## 2017-11-16 DIAGNOSIS — I4891 Unspecified atrial fibrillation: Secondary | ICD-10-CM

## 2017-11-16 MED ORDER — AMIODARONE 200 MG TAB
200 mg | ORAL_TABLET | Freq: Every day | ORAL | 1 refills | Status: DC
Start: 2017-11-16 — End: 2018-03-23

## 2017-11-16 MED ORDER — LISINOPRIL 2.5 MG TAB
2.5 mg | ORAL_TABLET | Freq: Every day | ORAL | 3 refills | Status: DC
Start: 2017-11-16 — End: 2018-04-24

## 2017-11-16 NOTE — Progress Notes (Signed)
HISTORY OF PRESENTING ILLNESS      Steve Green is a 57 y.o. male  with nonischemic cardiomyopathy, LVEF 15-20% (echocardiogram 08/07/17), moderate MR, atrial flutter s/p DCCV (on amiodarone) referred to discuss atrial flutter and ICD therapy for primary prevention of sudden death. He is currently on guideline directed medical therapy. He is on eliquis for CVA risk reduction. Last visit, 08/2017, discussed atrial flutter ablation; he wishes to defer at this time. He denies cardiac complaints today.     Repeat echocardiogram showed an EF of 39% and his Lifevest was discontinued. Cardiac MRI has been ordered. He has not been taking lisinopril for 2 months after running out of medication.       ACTIVE PROBLEM LIST     Patient Active Problem List    Diagnosis Date Noted   ??? Systolic CHF, chronic ([place]) 08/07/2017   ??? Atrial fibrillation with RVR ([place]) 08/06/2017   ??? Chest pain 08/06/2017   ??? Hyperglycemia 08/06/2017   ??? Elevated troponin 08/06/2017           PAST MEDICAL HISTORY     No past medical history on file.        PAST SURGICAL HISTORY     No past surgical history on file.       ALLERGIES     No Known Allergies       FAMILY HISTORY     Family History   Problem Relation Age of Onset   ??? Heart Disease Other     negative for cardiac disease       SOCIAL HISTORY     Social History     Socioeconomic History   ??? Marital status: SINGLE     Spouse name: Not on file   ??? Number of children: Not on file   ??? Years of education: Not on file   ??? Highest education level: Not on file   Tobacco Use   ??? Smoking status: Former Smoker     Packs/day: 0.50     Last attempt to quit: 07/13/2017     Years since quitting: 0.3   ??? Smokeless tobacco: Never Used   Substance and Sexual Activity   ??? Alcohol use: Not Currently   ??? Drug use: Never   ??? Sexual activity: Yes     Partners: Female         MEDICATIONS     Current Outpatient Medications   Medication Sig   ??? lisinopril (PRINIVIL, ZESTRIL) 2.5 mg tablet Take  by mouth daily.    ??? apixaban (ELIQUIS) 5 mg tablet Take 1 Tab by mouth two (2) times a day.   ??? amiodarone (CORDARONE) 200 mg tablet Take 1 Tab by mouth daily.   ??? aspirin 81 mg chewable tablet Take 1 Tab by mouth daily.   ??? metoprolol succinate (TOPROL-XL) 25 mg XL tablet Take 1 Tab by mouth daily.     No current facility-administered medications for this visit.        I have reviewed the nurses notes, vitals, problem list, allergy list, medical history, family, social history and medications.       REVIEW OF SYMPTOMS      General: Pt denies excessive weight gain or loss. Pt is able to conduct ADL's  HEENT: Denies blurred vision, headaches, hearing loss, epistaxis and difficulty swallowing.  Respiratory: Denies cough, congestion, shortness of breath, DOE, wheezing or stridor.  Cardiovascular: Denies precordial pain, palpitations, edema or PND  Gastrointestinal: Denies poor appetite, indigestion,  abdominal pain or blood in stool  Genitourinary: Denies hematuria, dysuria, increased urinary frequency  Musculoskeletal: Denies joint pain or swelling from muscles or joints  Neurologic: Denies tremor, paresthesias, headache, or sensory motor disturbance  Psychiatric: Denies confusion, insomnia, depression  Integumentray: Denies rash, itching or ulcers.  Hematologic: Denies easy bruising, bleeding       PHYSICAL EXAMINATION      There were no vitals filed for this visit.  General: Well developed, in no acute distress.  HEENT: No jaundice, oral mucosa moist, no oral ulcers  Neck: Supple, no stiffness, no lymphadenopathy, supple  Heart: ??Normal S1/S2 negative S3 or S4. Regular, no murmur, gallop or rub, no jugular venous distention  Respiratory: Clear bilaterally x 4, no wheezing or rales  Abdomen:?? ??Soft, non-tender, bowel sounds are active.??  Extremities:  No edema, normal cap refill, no cyanosis.  Musculoskeletal: No clubbing, no deformities  Neuro: A&Ox3, speech clear, gait stable, cooperative, no focal neurologic deficits   Skin: Skin color is normal. No rashes or lesions. Non diaphoretic, moist.  Vascular: 2+ pulses symmetric in all extremities       DIAGNOSTIC DATA      EKG:        LABORATORY DATA      Lab Results   Component Value Date/Time    WBC 10.7 08/09/2017 03:45 AM    HGB 13.0 08/09/2017 03:45 AM    HCT 38.6 08/09/2017 03:45 AM    PLATELET 164 08/09/2017 03:45 AM    MCV 98.2 08/09/2017 03:45 AM      Lab Results   Component Value Date/Time    Sodium 137 08/10/2017 04:09 AM    Potassium 3.8 08/10/2017 04:09 AM    Chloride 106 08/10/2017 04:09 AM    CO2 26 08/10/2017 04:09 AM    Anion gap 5 08/10/2017 04:09 AM    Glucose 101 (H) 08/10/2017 04:09 AM    BUN 16 08/10/2017 04:09 AM    Creatinine 1.01 08/10/2017 04:09 AM    BUN/Creatinine ratio 16 08/10/2017 04:09 AM    GFR est AA >60 08/10/2017 04:09 AM    GFR est non-AA >60 08/10/2017 04:09 AM    Calcium 8.5 08/10/2017 04:09 AM    Bilirubin, total 0.4 08/09/2017 03:45 AM    AST (SGOT) 31 08/09/2017 03:45 AM    Alk. phosphatase 120 (H) 08/09/2017 03:45 AM    Protein, total 6.3 (L) 08/09/2017 03:45 AM    Albumin 3.2 (L) 08/09/2017 03:45 AM    Globulin 3.1 08/09/2017 03:45 AM    A-G Ratio 1.0 (L) 08/09/2017 03:45 AM    ALT (SGPT) 75 08/09/2017 03:45 AM           ASSESSMENT      1. Cardiomyopathy              A. NICM              B. LVEF 20-25%              C. NYHA II              D. QRS duration 96 msec  2. Atrial flutter              A. DCCV              B. Amiodarone  3. Mitral regurgitation              A. Moderate     PLAN     Will restart lisinopril 2.53m  daily and confirm this medication with HF team. Decrease amiodarone to 164m daily. Continue other current medical therapy and will wait for results of cardiac MRI. Defer ICD for now based off of most recent echocardiogram.      FOLLOW-UP   3 months or sooner for abnormal EF on MRI    Thank you, TForrest Moron Dr. DMort Sawyers and Dr. LMilbert Coulterfor allowing me to participate in the care of this extraordinarily pleasant male. Please do  not hesitate to contact me for further questions/concerns.     LGrier Mitts NP

## 2017-11-16 NOTE — Progress Notes (Signed)
Room # 5    Echo 10/16/17    No longer wearing Life Vest    Denies any cardiac complaints at this time.     Ran out of Lisinopril and did have any refills ordered so he has been off of it for about 2 months    Visit Vitals  BP 110/74 (BP 1 Location: Left arm, BP Patient Position: Sitting)   Pulse 80   Resp 16   Ht 5' 8" (1.727 m)   Wt 152 lb (68.9 kg)   SpO2 97%   BMI 23.11 kg/m??

## 2017-11-16 NOTE — Progress Notes (Signed)
 Room # 5    Echo 10/16/17    No longer wearing Life Vest    Denies any cardiac complaints at this time.     Ran out of Lisinopril and did have any refills ordered so he has been off of it for about 2 months    Visit Vitals  BP 110/74 (BP 1 Location: Left arm, BP Patient Position: Sitting)   Pulse 80   Resp 16   Ht 5' 8 (1.727 m)   Wt 152 lb (68.9 kg)   SpO2 97%   BMI 23.11 kg/m

## 2017-11-16 NOTE — Progress Notes (Signed)
Progress  Notes by Grier Mitts, NP at 11/16/17 1120                Author: Grier Mitts, NP  Service: --  Author Type: Nurse Practitioner       Filed: 11/16/17 1153  Encounter Date: 11/16/2017  Status: Signed          Editor: Grier Mitts, NP (Nurse Practitioner)                                  HISTORY OF PRESENTING ILLNESS         Steve Green is a 57 y.o.  male  with nonischemic cardiomyopathy, LVEF 15-20% (echocardiogram 08/07/17), moderate MR, atrial flutter s/p DCCV (on amiodarone) referred  to discuss atrial flutter and ICD therapy for primary prevention of sudden death. He is currently on guideline directed medical therapy. He is on eliquis for CVA risk reduction. Last visit, 08/2017, discussed atrial flutter ablation; he wishes to defer  at this time. He denies cardiac complaints today.       Repeat echocardiogram showed an EF of 39% and his Lifevest was discontinued. Cardiac MRI has been ordered. He has not been taking lisinopril for 2 months after running out of  medication.            ACTIVE PROBLEM LIST          Patient Active Problem List           Diagnosis  Date Noted         ?  Systolic CHF, chronic ([place])  08/07/2017     ?  Atrial fibrillation with RVR ([place])  08/06/2017     ?  Chest pain  08/06/2017     ?  Hyperglycemia  08/06/2017         ?  Elevated troponin  08/06/2017                  PAST MEDICAL HISTORY        No past medical history on file.             PAST SURGICAL HISTORY        No past surgical history on file.            ALLERGIES        No Known Allergies            FAMILY HISTORY          Family History         Problem  Relation  Age of Onset          ?  Heart Disease  Other        negative for cardiac disease            SOCIAL HISTORY          Social History          Socioeconomic History         ?  Marital status:  SINGLE              Spouse name:  Not on file         ?  Number of children:  Not on file     ?  Years of education:  Not on file     ?  Highest education  level:  Not on file       Tobacco  Use         ?  Smoking status:  Former Smoker              Packs/day:  0.50         Last attempt to quit:  07/13/2017         Years since quitting:  0.3         ?  Smokeless tobacco:  Never Used       Substance and Sexual Activity         ?  Alcohol use:  Not Currently     ?  Drug use:  Never     ?  Sexual activity:  Yes              Partners:  Female                MEDICATIONS          Current Outpatient Medications        Medication  Sig         ?  lisinopril (PRINIVIL, ZESTRIL) 2.5 mg tablet  Take  by mouth daily.     ?  apixaban (ELIQUIS) 5 mg tablet  Take 1 Tab by mouth two (2) times a day.     ?  amiodarone (CORDARONE) 200 mg tablet  Take 1 Tab by mouth daily.     ?  aspirin 81 mg chewable tablet  Take 1 Tab by mouth daily.         ?  metoprolol succinate (TOPROL-XL) 25 mg XL tablet  Take 1 Tab by mouth daily.          No current facility-administered medications for this visit.            I have reviewed the nurses notes, vitals, problem list, allergy list, medical history, family, social history and medications.            REVIEW OF SYMPTOMS         General: Pt denies excessive weight gain or loss. Pt is able to conduct ADL's   HEENT: Denies blurred vision, headaches, hearing loss, epistaxis and difficulty swallowing.   Respiratory: Denies cough, congestion, shortness of breath, DOE, wheezing or stridor.   Cardiovascular: Denies precordial pain, palpitations, edema or PND   Gastrointestinal: Denies poor appetite, indigestion, abdominal pain or blood in stool   Genitourinary: Denies hematuria, dysuria, increased urinary frequency   Musculoskeletal: Denies joint pain or swelling from muscles or joints   Neurologic: Denies tremor, paresthesias, headache, or sensory motor disturbance   Psychiatric: Denies confusion, insomnia, depression   Integumentray: Denies rash, itching or ulcers.   Hematologic: Denies easy bruising, bleeding            PHYSICAL EXAMINATION         There were  no vitals filed for this visit.   General: Well developed, in no acute distress.   HEENT: No jaundice, oral mucosa moist, no oral ulcers   Neck: Supple, no stiffness, no lymphadenopathy, supple   Heart: ??Normal S1/S2 negative S3 or S4. Regular, no murmur, gallop or rub, no jugular venous distention   Respiratory: Clear bilaterally x 4, no wheezing or rales   Abdomen:?? ??Soft, non-tender, bowel sounds are active.??   Extremities:  No edema, normal cap refill, no cyanosis.   Musculoskeletal: No clubbing, no deformities   Neuro: A&Ox3, speech clear, gait stable, cooperative, no focal neurologic deficits   Skin: Skin color is normal. No rashes or  lesions. Non diaphoretic, moist.   Vascular: 2+ pulses symmetric in all extremities            DIAGNOSTIC DATA         EKG:             LABORATORY DATA           Lab Results         Component  Value  Date/Time            WBC  10.7  08/09/2017 03:45 AM       HGB  13.0  08/09/2017 03:45 AM       HCT  38.6  08/09/2017 03:45 AM       PLATELET  164  08/09/2017 03:45 AM            MCV  98.2  08/09/2017 03:45 AM           Lab Results         Component  Value  Date/Time            Sodium  137  08/10/2017 04:09 AM       Potassium  3.8  08/10/2017 04:09 AM       Chloride  106  08/10/2017 04:09 AM       CO2  26  08/10/2017 04:09 AM       Anion gap  5  08/10/2017 04:09 AM       Glucose  101 (H)  08/10/2017 04:09 AM       BUN  16  08/10/2017 04:09 AM       Creatinine  1.01  08/10/2017 04:09 AM       BUN/Creatinine ratio  16  08/10/2017 04:09 AM       GFR est AA  >60  08/10/2017 04:09 AM       GFR est non-AA  >60  08/10/2017 04:09 AM       Calcium  8.5  08/10/2017 04:09 AM       Bilirubin, total  0.4  08/09/2017 03:45 AM       AST (SGOT)  31  08/09/2017 03:45 AM       Alk. phosphatase  120 (H)  08/09/2017 03:45 AM       Protein, total  6.3 (L)  08/09/2017 03:45 AM       Albumin  3.2 (L)  08/09/2017 03:45 AM       Globulin  3.1  08/09/2017 03:45 AM       A-G Ratio  1.0 (L)  08/09/2017 03:45 AM             ALT (SGPT)  75  08/09/2017 03:45 AM                  ASSESSMENT         1. Cardiomyopathy               A. NICM               B. LVEF 20-25%               C. NYHA II               D. QRS duration 96 msec   2. Atrial flutter               A. DCCV               B. Amiodarone   3. Mitral  regurgitation               A. Moderate         PLAN        Will restart lisinopril 2.60m daily and confirm this medication with HF team. Decrease amiodarone to 1026mdaily. Continue other current medical therapy and will wait for results of cardiac MRI. Defer ICD for now based off of most recent echocardiogram.          FOLLOW-UP     3 months or sooner for abnormal EF on MRI      Thank you, TrForrest MoronDr. DoMort Sawyersand Dr. LiMilbert Coulteror allowing me to participate in the care of this extraordinarily pleasant male. Please  do not hesitate to contact me for further questions/concerns.       LaGrier MittsNP

## 2017-11-21 ENCOUNTER — Inpatient Hospital Stay
Payer: PRIVATE HEALTH INSURANCE | Attending: Cardiovascular Disease | Primary: Student in an Organized Health Care Education/Training Program

## 2017-11-27 ENCOUNTER — Ambulatory Visit
Admit: 2017-11-27 | Discharge: 2017-11-27 | Payer: PRIVATE HEALTH INSURANCE | Attending: Student in an Organized Health Care Education/Training Program | Primary: Student in an Organized Health Care Education/Training Program

## 2017-11-27 ENCOUNTER — Ambulatory Visit
Attending: Student in an Organized Health Care Education/Training Program | Primary: Student in an Organized Health Care Education/Training Program

## 2017-11-27 DIAGNOSIS — Z0000 Encounter for general adult medical examination without abnormal findings: Secondary | ICD-10-CM

## 2017-11-27 MED ORDER — CARBAMIDE PEROXIDE 6.5 % EAR DROPS
6.5 % | Freq: Two times a day (BID) | OTIC | 0 refills | Status: DC
Start: 2017-11-27 — End: 2017-12-03

## 2017-11-27 NOTE — Progress Notes (Signed)
I saw and evaluated the patient, performing the key elements of the service.  I discussed the findings, assessment and plan with the resident and agree with the resident's findings and plan as documented in the resident's note. I did not appreciate discrete submandibular LNs as described.  There was vague biateral subQ fullness.  Dr. Al-Bagdhadi will follow at next visit

## 2017-11-27 NOTE — Progress Notes (Signed)
Identified pt with two pt identifiers(name and DOB). Reviewed record in preparation for visit and have obtained necessary documentation.  Chief Complaint   Patient presents with   ??? New Patient        Health Maintenance Due   Topic   ??? Hepatitis C Screening    ??? Pneumococcal 0-64 years (1 of 1 - PPSV23)   ??? DTaP/Tdap/Td series (1 - Tdap)   ??? Shingrix Vaccine Age 59> (1 of 2)   ??? FOBT Q 1 YEAR AGE 59-75    ??? Influenza Age 9 to Adult        Visit Vitals  BP 121/71 (BP 1 Location: Right arm, BP Patient Position: Sitting)   Pulse 72   Temp 98.1 ??F (36.7 ??C) (Oral)   Resp 18   Ht 5' 8" (1.727 m)   Wt 149 lb 9.6 oz (67.9 kg)   SpO2 98%   BMI 22.75 kg/m??         Coordination of Care Questionnaire:  :   Do you have an Advanced Directive/ Living Will in place? NO  If no, would you like information NO    Patient is accompanied by self I have received verbal consent from Koleson A Marcano to discuss any/all medical information while they are present in the room.

## 2017-11-27 NOTE — Progress Notes (Signed)
Chief Complaint   Patient presents with   ??? New Patient       Subjective:   Steve Green is an 57 y.o. male who presents for complete physical exam.    Doing well. No complaints.    who presents to establish care. Pt follows up w/ cardiology (Dr. Lorella Nimrodoloresco) for A. Fib, for which he takes eliquis and metoprolol succinate. Pt has HTN on lisinopril. He also takes amiodarone for A flutter. Pt has NICM (NYHA 1); seeing Dr. [name] for possible ICD (has upcoming appointment on 03/04/18). Pt has been compliant to medications and has no concerns to discuss today. Pt w/ several labs ordered by cardiology for lipid screening + NICM work-up. Pt has yet to complete these labs.      Pt is a current smoker; 1 cigaretter every other day. Pt has been smoking for 2865yrs (1 pack per week).     Last tdap 10/20/17    Diet: varied; no restrictions  Exercise: minimal    Immunizations - reviewed:     There is no immunization history on file for this patient.  Tdap: 10/20/17    Health Maintenance reviewed -  Colonoscopy - never done  HIV + Hepatitis testing ordered by cardiology  Lung cancer screening - never donse      Allergies - reviewed:   No Known Allergies      Medications - reviewed:  Current Outpatient Medications   Medication Sig   ??? carbamide peroxide (DEBROX) 6.5 % otic solution Administer 5 Drops into each ear two (2) times a day for 4 days.   ??? lisinopril (PRINIVIL, ZESTRIL) 2.5 mg tablet Take 1 Tab by mouth daily.   ??? amiodarone (CORDARONE) 200 mg tablet Take 0.5 Tabs by mouth daily.   ??? apixaban (ELIQUIS) 5 mg tablet Take 1 Tab by mouth two (2) times a day.   ??? aspirin 81 mg chewable tablet Take 1 Tab by mouth daily.   ??? metoprolol succinate (TOPROL-XL) 25 mg XL tablet Take 1 Tab by mouth daily.     No current facility-administered medications for this visit.          Past Medical History - reviewed:  Past Medical History:   Diagnosis Date   ??? A-fib (HCC)    ??? Hypertension          Past Surgical History - reviewed:   History reviewed. No pertinent surgical history.      Family History - reviewed:  Family History   Problem Relation Age of Onset   ??? Heart Disease Other          Social History - reviewed:  Social History     Socioeconomic History   ??? Marital status: SINGLE     Spouse name: Not on file   ??? Number of children: Not on file   ??? Years of education: Not on file   ??? Highest education level: Not on file   Occupational History   ??? Not on file   Social Needs   ??? Financial resource strain: Not on file   ??? Food insecurity:     Worry: Not on file     Inability: Not on file   ??? Transportation needs:     Medical: Not on file     Non-medical: Not on file   Tobacco Use   ??? Smoking status: Current Some Day Smoker     Packs/day: 0.50     Years: 30.00     Pack  years: 15.00     Last attempt to quit: 07/13/2017     Years since quitting: 0.3   ??? Smokeless tobacco: Never Used   Substance and Sexual Activity   ??? Alcohol use: Yes     Alcohol/week: 24.0 standard drinks     Types: 24 Cans of beer per week   ??? Drug use: Never   ??? Sexual activity: Not Currently     Partners: Female   Lifestyle   ??? Physical activity:     Days per week: Not on file     Minutes per session: Not on file   ??? Stress: Not on file   Relationships   ??? Social connections:     Talks on phone: Not on file     Gets together: Not on file     Attends religious service: Not on file     Active member of club or organization: Not on file     Attends meetings of clubs or organizations: Not on file     Relationship status: Not on file   ??? Intimate partner violence:     Fear of current or ex partner: Not on file     Emotionally abused: Not on file     Physically abused: Not on file     Forced sexual activity: Not on file   Other Topics Concern   ??? Not on file   Social History Narrative   ??? Not on file         Review of Systems   CONSTITUTIONAL: Denies: fever, chills  PSYCH: Denies: anxiety, depression      Objective:     Visit Vitals   BP 121/71 (BP 1 Location: Right arm, BP Patient Position: Sitting)   Pulse 72   Temp 98.1 ??F (36.7 ??C) (Oral)   Resp 18   Ht 5\' 8"  (1.727 m)   Wt 149 lb 9.6 oz (67.9 kg)   SpO2 98%   BMI 22.75 kg/m??       General appearance - alert, well appearing, and in no distress  Eyes - sclera anicteric and without injection   Ears - L cerumen impaction; R TM wnl. B/l external ear canals normal  Nose - normal and patent, no erythema, discharge or polyps  Mouth - mucous membranes moist, pharynx normal without lesions  Neck - supple, mildly enlarged submandibular LN (R ~1.5cm, L~1cm; rubbery, mobile, non-tender, thyroid exam: thyroid is normal in size without nodules or tenderness  Chest - clear to auscultation, no wheezes, rales or rhonchi, symmetric air entry  Heart - normal rate, regular rhythm, normal S1, S2, no murmurs, rubs, clicks or gallops  Abdomen - soft, nontender, nondistended, no masses or organomegaly  Neurological - alert, oriented, normal speech, no focal findings or movement disorder noted  Musculoskeletal - no joint tenderness, deformity or swelling  Extremities - no pedal edema noted  Skin - normal coloration and turgor, no rashes, no suspicious skin lesions noted      Assessment:       ICD-10-CM ICD-9-CM    1. Well adult exam Z00.00 V70.0 REFERRAL TO GASTROENTEROLOGY   2. Diabetes mellitus screening Z13.1 V77.1 HEMOGLOBIN A1C WITH EAG   3. Encounter for medical examination to establish care Z00.00 V70.9    4. Left ear impacted cerumen H61.22 380.4 carbamide peroxide (DEBROX) 6.5 % otic solution   5. Cigarette smoker F17.210 305.1 CT LOW DOSE LUNG CANCER SCREENING           Plan:  Well adult exam; Establish Care  - REFERRAL TO GASTROENTEROLOGY  - CT LOW DOSE LUNG CANCER SCREENING  - HEMOGLOBIN A1C WITH EAG for DM screening  - Pt to obtain labs ordered by cardiology and continue follow-up  - Monitor possibly enlarged submandibular LN on next PE  - Shingrix script given    Left ear impacted cerumen   - carbamide peroxide (DEBROX) 6.5 % otic solution; Administer 5 Drops into each ear two (2) times a day for 4 days.  Dispense: 5 mL; Refill: 0    Cigarette smoker  Unclear pack year hx; pt recalls having smoked >1pack/wk but can't accurately quantify; has been smoking ~52yrs. Offered lung cancer screening to pt who agreed to do so in order to obtain BL.  - CT LOW DOSE LUNG CANCER SCREENING; Future  - Encouraged cessation and educated on benefits of cessation and harms of smoking     I have discussed the diagnosis with the patient and the intended plan as seen in the above orders.  The patient has received an after-visit summary and questions were answered concerning future plans.  I have discussed medication side effects and warnings with the patient as well. Informed pt to return to the office if new symptoms arise.    Pt seen and discussed w/ Dr. Sanda Klein, Family Medicine Attending    Dejan Angert Al-Baghdadi, MD  Family Medicine Resident

## 2017-11-27 NOTE — Progress Notes (Signed)
Letter sent to pt

## 2017-11-27 NOTE — Patient Instructions (Addendum)
Well Visit, Men 50 to 65: Care Instructions  Your Care Instructions    Physical exams can help you stay healthy. Your doctor has checked your overall health and may have suggested ways to take good care of yourself. He or she also may have recommended tests. At home, you can help prevent illness with healthy eating, regular exercise, and other steps.  Follow-up care is a key part of your treatment and safety. Be sure to make and go to all appointments, and call your doctor if you are having problems. It's also a good idea to know your test results and keep a list of the medicines you take.  How can you care for yourself at home?  ?? Reach and stay at a healthy weight. This will lower your risk for many problems, such as obesity, diabetes, heart disease, and high blood pressure.  ?? Get at least 30 minutes of exercise on most days of the week. Walking is a good choice. You also may want to do other activities, such as running, swimming, cycling, or playing tennis or team sports.  ?? Do not smoke. Smoking can make health problems worse. If you need help quitting, talk to your doctor about stop-smoking programs and medicines. These can increase your chances of quitting for good.  ?? Protect your skin from too much sun. When you're outdoors from 10 a.m. to 4 p.m., stay in the shade or cover up with clothing and a hat with a wide brim. Wear sunglasses that block UV rays. Even when it's cloudy, put broad-spectrum sunscreen (SPF 30 or higher) on any exposed skin.  ?? See a dentist one or two times a year for checkups and to have your teeth cleaned.  ?? Wear a seat belt in the car.  Follow your doctor's advice about when to have certain tests. These tests can spot problems early.  ?? Cholesterol. Your doctor will tell you how often to have this done based on your overall health and other things that can increase your risk for heart attack and stroke.  ?? Blood pressure. Have your blood pressure checked during a routine doctor  visit. Your doctor will tell you how often to check your blood pressure based on your age, your blood pressure results, and other factors.  ?? Prostate exam. Talk to your doctor about whether you should have a blood test (called a PSA test) for prostate cancer. Experts recommend that you discuss the benefits and risks of the test with your doctor before you decide whether to have this test.  ?? Diabetes. Ask your doctor whether you should have tests for diabetes.  ?? Vision. Some experts recommend that you have yearly exams for glaucoma and other age-related eye problems starting at age 50.  ?? Hearing. Tell your doctor if you notice any change in your hearing. You can have tests to find out how well you hear.  ?? Colorectal cancer. Your risk for colorectal cancer gets higher as you get older. Some experts say that adults should start regular screening at age 50 and stop at age 75. Others say to start before age 50 or continue after age 75. Talk with your doctor about your risk and when to start and stop screening.  ?? Heart attack and stroke risk. At least every 4 to 6 years, you should have your risk for heart attack and stroke assessed. Your doctor uses factors such as your age, blood pressure, cholesterol, and whether you smoke or have diabetes to show   what your risk for a heart attack or stroke is over the next 10 years.  ?? Abdominal aortic aneurysm. Ask your doctor whether you should have a test to check for an aneurysm. You may need a test if you ever smoked or if your parent, brother, sister, or child has had an aneurysm.  When should you call for help?  Watch closely for changes in your health, and be sure to contact your doctor if you have any problems or symptoms that concern you.  Where can you learn more?  Go to InsuranceStats.cahttp://www.healthwise.net/GoodHelpConnections.  Enter [phone] in the search box to learn more about "Well Visit, Men 50 to 2365: Care Instructions."  Current as of: March 22, 2017   Content Version: 12.1  ?? 2006-2019 [employer]. Care instructions adapted under license by [employer] (which disclaims liability or warranty for this information). If you have questions about a medical condition or this instruction, always ask your healthcare professional. [employer] Inc[employer] disclaims any warranty or liability for your use of this information.    1-800-QUIT SMOKING  https://hill.biz/https://smokefree.gov/quit?utm_source=bing&utm_medium=search&utm_campaign=2019ncismokefree&utm_content=search_g

## 2017-11-27 NOTE — Progress Notes (Signed)
Chief Complaint   Patient presents with   ??? New Patient       Subjective:   Steve Green is an 57 y.o. male who presents for complete physical exam.    Doing well. No complaints.    who presents to establish care. Pt follows up w/ cardiology (Dr. Lorella Nimrodoloresco) for A. Fib, for which he takes eliquis and metoprolol succinate. Pt has HTN on lisinopril. He also takes amiodarone for A flutter. Pt has NICM (NYHA 1); seeing Dr. [name] for possible ICD (has upcoming appointment on 03/04/18). Pt has been compliant to medications and has no concerns to discuss today. Pt w/ several labs ordered by cardiology for lipid screening + NICM work-up. Pt has yet to complete these labs.      Pt is a current smoker; 1 cigaretter every other day. Pt has been smoking for 4680yrs (1 pack per week).     Last tdap 10/20/17    Diet: varied; no restrictions  Exercise: minimal    Immunizations - reviewed:     There is no immunization history on file for this patient.  Tdap: 10/20/17    Health Maintenance reviewed -  Colonoscopy - never done  HIV + Hepatitis testing ordered by cardiology  Lung cancer screening - never donse      Allergies - reviewed:   No Known Allergies      Medications - reviewed:  Current Outpatient Medications   Medication Sig   ??? carbamide peroxide (DEBROX) 6.5 % otic solution Administer 5 Drops into each ear two (2) times a day for 4 days.   ??? lisinopril (PRINIVIL, ZESTRIL) 2.5 mg tablet Take 1 Tab by mouth daily.   ??? amiodarone (CORDARONE) 200 mg tablet Take 0.5 Tabs by mouth daily.   ??? apixaban (ELIQUIS) 5 mg tablet Take 1 Tab by mouth two (2) times a day.   ??? aspirin 81 mg chewable tablet Take 1 Tab by mouth daily.   ??? metoprolol succinate (TOPROL-XL) 25 mg XL tablet Take 1 Tab by mouth daily.     No current facility-administered medications for this visit.          Past Medical History - reviewed:  Past Medical History:   Diagnosis Date   ??? A-fib (HCC)    ??? Hypertension          Past Surgical History - reviewed:  History  reviewed. No pertinent surgical history.      Family History - reviewed:  Family History   Problem Relation Age of Onset   ??? Heart Disease Other          Social History - reviewed:  Social History     Socioeconomic History   ??? Marital status: SINGLE     Spouse name: Not on file   ??? Number of children: Not on file   ??? Years of education: Not on file   ??? Highest education level: Not on file   Occupational History   ??? Not on file   Social Needs   ??? Financial resource strain: Not on file   ??? Food insecurity:     Worry: Not on file     Inability: Not on file   ??? Transportation needs:     Medical: Not on file     Non-medical: Not on file   Tobacco Use   ??? Smoking status: Current Some Day Smoker     Packs/day: 0.50     Years: 30.00     Pack  years: 15.00     Last attempt to quit: 07/13/2017     Years since quitting: 0.3   ??? Smokeless tobacco: Never Used   Substance and Sexual Activity   ??? Alcohol use: Yes     Alcohol/week: 24.0 standard drinks     Types: 24 Cans of beer per week   ??? Drug use: Never   ??? Sexual activity: Not Currently     Partners: Female   Lifestyle   ??? Physical activity:     Days per week: Not on file     Minutes per session: Not on file   ??? Stress: Not on file   Relationships   ??? Social connections:     Talks on phone: Not on file     Gets together: Not on file     Attends religious service: Not on file     Active member of club or organization: Not on file     Attends meetings of clubs or organizations: Not on file     Relationship status: Not on file   ??? Intimate partner violence:     Fear of current or ex partner: Not on file     Emotionally abused: Not on file     Physically abused: Not on file     Forced sexual activity: Not on file   Other Topics Concern   ??? Not on file   Social History Narrative   ??? Not on file         Review of Systems   CONSTITUTIONAL: Denies: fever, chills  PSYCH: Denies: anxiety, depression      Objective:     Visit Vitals  BP 121/71 (BP 1 Location: Right arm, BP Patient Position:  Sitting)   Pulse 72   Temp 98.1 ??F (36.7 ??C) (Oral)   Resp 18   Ht 5\' 8"  (1.727 m)   Wt 149 lb 9.6 oz (67.9 kg)   SpO2 98%   BMI 22.75 kg/m??       General appearance - alert, well appearing, and in no distress  Eyes - sclera anicteric and without injection   Ears - L cerumen impaction; R TM wnl. B/l external ear canals normal  Nose - normal and patent, no erythema, discharge or polyps  Mouth - mucous membranes moist, pharynx normal without lesions  Neck - supple, mildly enlarged submandibular LN (R ~1.5cm, L~1cm; rubbery, mobile, non-tender, thyroid exam: thyroid is normal in size without nodules or tenderness  Chest - clear to auscultation, no wheezes, rales or rhonchi, symmetric air entry  Heart - normal rate, regular rhythm, normal S1, S2, no murmurs, rubs, clicks or gallops  Abdomen - soft, nontender, nondistended, no masses or organomegaly  Neurological - alert, oriented, normal speech, no focal findings or movement disorder noted  Musculoskeletal - no joint tenderness, deformity or swelling  Extremities - no pedal edema noted  Skin - normal coloration and turgor, no rashes, no suspicious skin lesions noted      Assessment:       ICD-10-CM ICD-9-CM    1. Well adult exam Z00.00 V70.0 REFERRAL TO GASTROENTEROLOGY   2. Diabetes mellitus screening Z13.1 V77.1 HEMOGLOBIN A1C WITH EAG   3. Encounter for medical examination to establish care Z00.00 V70.9    4. Left ear impacted cerumen H61.22 380.4 carbamide peroxide (DEBROX) 6.5 % otic solution   5. Cigarette smoker F17.210 305.1 CT LOW DOSE LUNG CANCER SCREENING           Plan:  Well adult exam; Establish Care  - REFERRAL TO GASTROENTEROLOGY  - CT LOW DOSE LUNG CANCER SCREENING  - HEMOGLOBIN A1C WITH EAG for DM screening  - Pt to obtain labs ordered by cardiology and continue follow-up  - Monitor possibly enlarged submandibular LN on next PE  - Shingrix script given    Left ear impacted cerumen  - carbamide peroxide (DEBROX) 6.5 % otic solution; Administer 5 Drops  into each ear two (2) times a day for 4 days.  Dispense: 5 mL; Refill: 0    Cigarette smoker  Unclear pack year hx; pt recalls having smoked >1pack/wk but can't accurately quantify; has been smoking ~32yrs. Offered lung cancer screening to pt who agreed to do so in order to obtain BL.  - CT LOW DOSE LUNG CANCER SCREENING; Future  - Encouraged cessation and educated on benefits of cessation and harms of smoking     I have discussed the diagnosis with the patient and the intended plan as seen in the above orders.  The patient has received an after-visit summary and questions were answered concerning future plans.  I have discussed medication side effects and warnings with the patient as well. Informed pt to return to the office if new symptoms arise.    Pt seen and discussed w/ Dr. Sanda Klein, Family Medicine Attending    Whitfield Dulay Al-Baghdadi, MD  Family Medicine Resident

## 2017-11-27 NOTE — Progress Notes (Signed)
Identified pt with two pt identifiers(name and DOB). Reviewed record in preparation for visit and have obtained necessary documentation.  Chief Complaint   Patient presents with   . New Patient        Health Maintenance Due   Topic   . Hepatitis C Screening    . Pneumococcal 0-64 years (1 of 1 - PPSV23)   . DTaP/Tdap/Td series (1 - Tdap)   . Shingrix Vaccine Age 59> (1 of 2)   . FOBT Q 1 YEAR AGE 35-75    . Influenza Age 37 to Adult        Visit Vitals  BP 121/71 (BP 1 Location: Right arm, BP Patient Position: Sitting)   Pulse 72   Temp 98.1 F (36.7 C) (Oral)   Resp 18   Ht 5\' 8"  (1.727 m)   Wt 149 lb 9.6 oz (67.9 kg)   SpO2 98%   BMI 22.75 kg/m         Coordination of Care Questionnaire:  :   Do you have an Advanced Directive/ Living Will in place? NO  If no, would you like information NO    Patient is accompanied by self I have received verbal consent from [name] to discuss any/all medical information while they are present in the room.

## 2017-11-27 NOTE — Progress Notes (Signed)
I saw and evaluated the patient, performing the key elements of the service.  I discussed the findings, assessment and plan with the resident and agree with the resident's findings and plan as documented in the resident's note. I did not appreciate discrete submandibular LNs as described.  There was vague biateral subQ fullness.  Dr. Iven FinnAl-Bagdhadi will follow at next visit

## 2017-11-28 LAB — HEMOGLOBIN A1C W/EAG
Hemoglobin A1C: 5.4 % (ref 4.8–5.6)
eAG: 108 mg/dL

## 2017-11-28 LAB — HEMOGLOBIN A1C WITH EAG
Estimated average glucose: 108 mg/dL
Hemoglobin A1c: 5.4 % (ref 4.8–5.6)

## 2017-12-03 ENCOUNTER — Encounter

## 2017-12-03 NOTE — Telephone Encounter (Signed)
-----   Message from Phoebe Sharpsobin R Davis sent at 12/03/2017 11:58 AM EDT -----  Regarding: Dr. Karie ChimeraAgbeibor/ Refill   Medication Refill    Caller (if not patient):       Relationship of caller (if not patient):      Best contact number(s): [phone]    Name of medication and dosage if known: "Carbamibe Peroxide"       Is patient out of this medication (yes/no):  Pt stated he has lost last prescription refill     Pharmacy name:    Pharmacy listed in chart? (yes/no): Pharmacy on file   Pharmacy phone number:      Details to clarify the request: Pt stated he lost previous prescription and is need need of another prescription refill of medication.      Phoebe Sharpsobin R Davis

## 2017-12-04 MED ORDER — CARBAMIDE PEROXIDE 6.5 % EAR DROPS
6.5 % | Freq: Two times a day (BID) | OTIC | 0 refills | Status: AC
Start: 2017-12-04 — End: 2017-12-08

## 2017-12-04 NOTE — Telephone Encounter (Signed)
LVM for appt reminder

## 2017-12-05 ENCOUNTER — Ambulatory Visit
Admit: 2017-12-05 | Discharge: 2017-12-06 | Payer: PRIVATE HEALTH INSURANCE | Attending: Cardiovascular Disease | Primary: Student in an Organized Health Care Education/Training Program

## 2017-12-05 ENCOUNTER — Encounter

## 2017-12-05 ENCOUNTER — Ambulatory Visit: Attending: Cardiovascular Disease | Primary: Student in an Organized Health Care Education/Training Program

## 2017-12-05 DIAGNOSIS — I4891 Unspecified atrial fibrillation: Secondary | ICD-10-CM

## 2017-12-05 MED ORDER — METOPROLOL SUCCINATE SR 25 MG 24 HR TAB
25 mg | ORAL_TABLET | Freq: Every day | ORAL | 1 refills | Status: DC
Start: 2017-12-05 — End: 2018-03-23

## 2017-12-05 NOTE — Progress Notes (Signed)
ADVANCED HEART FAILURE CENTER  [facility] in [place], [place]  Heart Failure Clinic Note    Patient name: Steve Green  Patient DOB: 04-27-1960  Patient MRN: [id number]  Date of service: 12/05/17    Primary care physician: Stephenie Acres, MD  Primary cardiologist: Dr. Lorella Nimrod       CHIEF COMPLAINT:  Chronic systolic heart failure  ??  PLAN:  Reinforced importance of compliance; not taking some of the medications; not done HF labs (repeatedly ordered)  Would resume toprol 12.5mg  daily; he does not know why he does not have a bottle with this medication  Continue lisinopril 2.5mg  twice daily; need to uptitrate dose with next visit  Has not been taking spironolactone; will not resume since he also did not have labs drawn and now LVEF improved 2/2 no indication  Does not need diuretics, euvolemic  Continue amiodarone??200mg  daily, per EP cardiology   Chronic anticoagulation with??eliquis; apparently has not been taking; says was told to stop; will check with his cardiologist  Not taking ASA or statin  Labs ordered again: CBC, BMP,??Mg,??LFT, pro-NT-BNP, iron profile, thyroid profile, vitamin D level, lipid profile, CPK, SFC, SPEP/IF,??HIV, hepatitis profile, coxackie antibodies, Lyme's anitbodies, genetic testing for aTTR  D/w patient no cMRI ordered until we have lab results (Cr) on ACEi   Reinforced low salt diet  Reinforced fluid restriction to 6 x 8oz glasses per day  Discussed abstinence from illicit drugs, commanded on tobacco abstinence  Counseled to obtain home scale and arm blood pressure cuff  Document in diary BP/HR before and 2 hours after taking medications and daily weights  Provided educational materials "Living with heart failure"  Needs to fill out ACP??forms  Follow-up with primary cardiologist, Dr. Lorella Nimrod  Follow-up with EP cardiologist  Establish and follow-up with PCP; recommend flu vaccine annually and pneumonia vaccine every 5 years   Return to AHF Clinic in??January 2020; will have two appointments with cardiology and EP cardiology between    IMPRESSION:  Chronic systolic heart failure  Stage C, NYHA class I symptoms  Non-ischemic cardiomyopathy, LVEF 16-20% improved to 39%  Etiology of cardiomyopathy likely tachycardia-mediated  Atrial flutter with RVR s/p DCCV; resolved  Normal coronaries by LHC  Moderate mitral regurgitation, resolved  Chronic anticoagulation with eliquis; patient discontinued  UDS positive for marijuana (08/08/17)  Medical non-compliance  ??  CARDIAC IMAGING:  Echo??(08/07/17) LVEF 16-20%, moderate MR  EKG??(08/14/17) NSR, non-specific ST-T changes, QRS 96ms  LHC??(08/09/17) normal cors  ??  HEMODYNAMICS:  RHC??(08/09/17) 12, 45/22/31, CO/CI 5.4/2.9  CPEST not done  not done  ??  OTHER IMAGING:  CXR??(08/09/17)??Unchanged cardiomegaly and mild pulmonary interstitial edema. Probable tiny right pleural effusion.  ??  HISTORY OF PRESENT ILLNESS:  I had the pleasure of seeing??Steve Green??in Advanced [facility] Failure Clinic at [employer]. [facility] in [place].  ??  Briefly,??Steve Green??is a 57 y.o.??male??with h/o THC use, non-ischemic cardiomyopathy LVEF 15-20% (by echo 08/07/17), moderate MR and atrial flutter with RVR s/p DCCV currently on amiodarone and chronic anticoagulation with eliquis. Patient was seen by Dr. Irving Burton with EP cardiology and declined atrial flutter ablation, repeat echocardiogram in July 2019 showed LVEF 39%. Lifevest was discontinued. There is ongoing concern regarding compliance with medications. Reported previously being out of lisinopril for 2 months (11/16/17), being told to stop taking beta-blockers and eliquis (12/05/17), not having time to draw labs (repeatedly ordered), etc.   ??  INTERVAL HISTORY:  Today, patient presents for routine clinic  visit.  ??  Patient is doing very well.    Patient walked to our clinic from parking garage without having to slow  down or stop. Patient can walk more than one block without symptoms of fatigue or shortness of breath. Patient can walk one flight of stairs without symptoms of fatigue or shortness of breath. Patient can perform home activities without problem and routinely participates in daily walking for more than 15 minutes.     Patient denies symptoms of volume overload or leg edema. Patient denies abdominal bloating or change of appetite. Patient's weight remained stable.     Patient denies orthopnea, PND or nocturia.    Patient denies irregular heart rate or palpitations.     Patient denies other cardiac symptoms such as chest pain or leg pain with walking.     Patient is compliant with fluid restriction and taking medications as prescribed. Patient manages his own medications.     REVIEW OF SYSTEMS:  General: Denies fever, night sweats.  Ear, nose and throat: Denies difficulty hearing, sinus problems, runny nose, post-nasal drip, ringing in ears, mouth sores, loose teeth, ear pain, nosebleeds, sore throate, facial pain or numbess  Cardiovascular: see above in the interval history  Respiratory: Denies cough, wheezing, sputum production, hemoptysis.  Gastrointestinal: Denies heartburn, constipation, intolerance to certain foods, diarrhea, abdominal pain, nausea, vomiting, difficulty swallowing, blood in stool  Kidney and bladder: Denies painful urination, frequent urination, urgency, prostate problems and impotence  Musculoskeletal: Denies joint pain, muscle weakness  Skin and hair: Denies change in existing skin lesions, hair loss or increase, breast changes    PHYSICAL EXAM:  Visit Vitals  Ht 5\' 8"  (1.727 m)   Wt 154 lb (69.9 kg)   BMI 23.42 kg/m??     General: Patient is well developed, well-nourished in no acute distress  HEENT: Normocephalic and atraumatic. No scleral icterus. Pupils are equal, round and reactive to light and accomodation. No conjunctival injection. Oropharynx is clear.    Neck: Supple. No evidence of thyroid enlargements or lymphadenopathy.  JVD: Cannot be appreciated   Lungs: Breath sounds are equal and clear bilaterally. No wheezes, rhonchi, or rales.  Heart: Regular rate and rhythm with normal S1 and S2. No murmurs, gallops or rubs.  Abdomen: Soft, no mass or tenderness. No organomegaly or hernia. Bowel sounds present.  Genitourinary and rectal: deferred  Extremities: No cyanosis, clubbing, or edema.  Neurologic: No focal sensory or motor deficits are noted. Grossly intact.  Psychiatric: Awake, alert an doriented x 3. Appropriate mood and affect.  Skin: Warm, dry and well perfused. No lesions, nodules or rashes are noted.    PAST MEDICAL HISTORY:  Past Medical History:   Diagnosis Date   ??? A-fib (HCC)    ??? Hypertension        PAST SURGICAL HISTORY:  No past surgical history on file.    FAMILY HISTORY:  Family History   Problem Relation Age of Onset   ??? Heart Disease Other        SOCIAL HISTORY:  Social History     Socioeconomic History   ??? Marital status: SINGLE     Spouse name: Not on file   ??? Number of children: Not on file   ??? Years of education: Not on file   ??? Highest education level: Not on file   Tobacco Use   ??? Smoking status: Current Some Day Smoker     Packs/day: 0.50     Years: 30.00  Pack years: 15.00     Last attempt to quit: 07/13/2017     Years since quitting: 0.3   ??? Smokeless tobacco: Never Used   Substance and Sexual Activity   ??? Alcohol use: Yes     Alcohol/week: 24.0 standard drinks     Types: 24 Cans of beer per week   ??? Drug use: Never   ??? Sexual activity: Not Currently     Partners: Female       LABORATORY RESULTS:   No flowsheet data found.  Lab Results   Component Value Date/Time    TSH 1.22 08/06/2017 08:20 PM       ALLERGY:  No Known Allergies     CURRENT MEDICATIONS:    Current Outpatient Medications:   ???  carbamide peroxide (DEBROX) 6.5 % otic solution, Administer 5 Drops into each ear two (2) times a day for 4 days., Disp: 5 mL, Rfl: 0   ???  lisinopril (PRINIVIL, ZESTRIL) 2.5 mg tablet, Take 1 Tab by mouth daily., Disp: 30 Tab, Rfl: 3  ???  amiodarone (CORDARONE) 200 mg tablet, Take 0.5 Tabs by mouth daily., Disp: 30 Tab, Rfl: 1  ???  apixaban (ELIQUIS) 5 mg tablet, Take 1 Tab by mouth two (2) times a day. (Patient taking differently: Take 5 mg by mouth two (2) times a day. Indications: pt states he is not taking), Disp: 60 Tab, Rfl: 0  ???  aspirin 81 mg chewable tablet, Take 1 Tab by mouth daily. (Patient taking differently: Take 81 mg by mouth daily. Indications: pt states he is not taking), Disp: 30 Tab, Rfl: 0  ???  metoprolol succinate (TOPROL-XL) 25 mg XL tablet, Take 1 Tab by mouth daily. (Patient taking differently: Take 25 mg by mouth daily. Indications: pt states he is not taking), Disp: 30 Tab, Rfl: 0    Thank you for your referral and allowing me to participate in this patient's care.    Antony ContrasKatherine Frederico Gerling, MD PhD  [facility] Failure [facility]  [facility]  [address], [address]  Phone: [phone]  Fax: [phone]

## 2017-12-05 NOTE — Telephone Encounter (Signed)
Patient called to let us know he will arrive late to his appt, approximately 2:36pm.  I told him will be ok.

## 2017-12-05 NOTE — Patient Instructions (Signed)
Begin taking metoprolol 12.5 mg daily  Call Dr. Lorella Nimrodoloresco to determine if you are to be taking Eliquis  Get labs today  [facility]

## 2017-12-05 NOTE — Progress Notes (Signed)
ADVANCED HEART FAILURE CENTER  [facility] in [place], [place]  Heart Failure Clinic Note    Patient name: Steve Green  Patient DOB: [birth year]  Patient MRN: [id number]  Date of service: 12/05/17    Primary care physician: Stephenie Acres, MD  Primary cardiologist: Dr. Lorella Nimrod       CHIEF COMPLAINT:  Chronic systolic heart failure  ??  PLAN:  Reinforced importance of compliance; not taking some of the medications; not done HF labs (repeatedly ordered)  Would resume toprol 12.5mg  daily; he does not know why he does not have a bottle with this medication  Continue lisinopril 2.5mg  twice daily; need to uptitrate dose with next visit  Has not been taking spironolactone; will not resume since he also did not have labs drawn and now LVEF improved 2/2 no indication  Does not need diuretics, euvolemic  Continue amiodarone??200mg  daily, per EP cardiology   Chronic anticoagulation with??eliquis; apparently has not been taking; says was told to stop; will check with his cardiologist  Not taking ASA or statin  Labs ordered again: CBC, BMP,??Mg,??LFT, pro-NT-BNP, iron profile, thyroid profile, vitamin D level, lipid profile, CPK, SFC, SPEP/IF,??HIV, hepatitis profile, coxackie antibodies, Lyme's anitbodies, genetic testing for aTTR  D/w patient no cMRI ordered until we have lab results (Cr) on ACEi   Reinforced low salt diet  Reinforced fluid restriction to 6 x 8oz glasses per day  Discussed abstinence from illicit drugs, commanded on tobacco abstinence  Counseled to obtain home scale and arm blood pressure cuff  Document in diary BP/HR before and 2 hours after taking medications and daily weights  Provided educational materials "Living with heart failure"  Needs to fill out ACP??forms  Follow-up with primary cardiologist, Dr. Lorella Nimrod  Follow-up with EP cardiologist  Establish and follow-up with PCP; recommend flu vaccine annually and pneumonia vaccine every 5 years  Return to AHF Clinic in??January 2020; will have two  appointments with cardiology and EP cardiology between    IMPRESSION:  Chronic systolic heart failure  Stage C, NYHA class I symptoms  Non-ischemic cardiomyopathy, LVEF 16-20% improved to 39%  Etiology of cardiomyopathy likely tachycardia-mediated  Atrial flutter with RVR s/p DCCV; resolved  Normal coronaries by LHC  Moderate mitral regurgitation, resolved  Chronic anticoagulation with eliquis; patient discontinued  UDS positive for marijuana (08/08/17)  Medical non-compliance  ??  CARDIAC IMAGING:  Echo??(08/07/17) LVEF 16-20%, moderate MR  EKG??(08/14/17) NSR, non-specific ST-T changes, QRS 96ms  LHC??(08/09/17) normal cors  ??  HEMODYNAMICS:  RHC??(08/09/17) 12, 45/22/31, CO/CI 5.4/2.9  CPEST not done  not done  ??  OTHER IMAGING:  CXR??(08/09/17)??Unchanged cardiomegaly and mild pulmonary interstitial edema. Probable tiny right pleural effusion.  ??  HISTORY OF PRESENT ILLNESS:  I had the pleasure of seeing??Steve Green??in Advanced Heart Failure Clinic at [employer]. [facility] in [place].  ??  Briefly,??Steve Green??is a 57 y.o.??male??with h/o THC use, non-ischemic cardiomyopathy LVEF 15-20% (by echo 08/07/17), moderate MR and atrial flutter with RVR s/p DCCV currently on amiodarone and chronic anticoagulation with eliquis. Patient was seen by Dr. Irving Burton with EP cardiology and declined atrial flutter ablation, repeat echocardiogram in July 2019 showed LVEF 39%. Lifevest was discontinued. There is ongoing concern regarding compliance with medications. Reported previously being out of lisinopril for 2 months (11/16/17), being told to stop taking beta-blockers and eliquis (12/05/17), not having time to draw labs (repeatedly ordered), etc.   ??  INTERVAL HISTORY:  Today, patient presents for routine clinic  visit.  ??  Patient is doing very well.    Patient walked to our clinic from parking garage without having to slow down or stop. Patient can walk more than one block without symptoms of fatigue or shortness of breath. Patient  can walk one flight of stairs without symptoms of fatigue or shortness of breath. Patient can perform home activities without problem and routinely participates in daily walking for more than 15 minutes.     Patient denies symptoms of volume overload or leg edema. Patient denies abdominal bloating or change of appetite. Patient's weight remained stable.     Patient denies orthopnea, PND or nocturia.    Patient denies irregular heart rate or palpitations.     Patient denies other cardiac symptoms such as chest pain or leg pain with walking.     Patient is compliant with fluid restriction and taking medications as prescribed. Patient manages his own medications.     REVIEW OF SYSTEMS:  General: Denies fever, night sweats.  Ear, nose and throat: Denies difficulty hearing, sinus problems, runny nose, post-nasal drip, ringing in ears, mouth sores, loose teeth, ear pain, nosebleeds, sore throate, facial pain or numbess  Cardiovascular: see above in the interval history  Respiratory: Denies cough, wheezing, sputum production, hemoptysis.  Gastrointestinal: Denies heartburn, constipation, intolerance to certain foods, diarrhea, abdominal pain, nausea, vomiting, difficulty swallowing, blood in stool  Kidney and bladder: Denies painful urination, frequent urination, urgency, prostate problems and impotence  Musculoskeletal: Denies joint pain, muscle weakness  Skin and hair: Denies change in existing skin lesions, hair loss or increase, breast changes    PHYSICAL EXAM:  Visit Vitals  Ht 5\' 8"  (1.727 m)   Wt 154 lb (69.9 kg)   BMI 23.42 kg/m??     General: Patient is well developed, well-nourished in no acute distress  HEENT: Normocephalic and atraumatic. No scleral icterus. Pupils are equal, round and reactive to light and accomodation. No conjunctival injection. Oropharynx is clear.   Neck: Supple. No evidence of thyroid enlargements or lymphadenopathy.  JVD: Cannot be appreciated   Lungs: Breath sounds are equal and clear  bilaterally. No wheezes, rhonchi, or rales.  Heart: Regular rate and rhythm with normal S1 and S2. No murmurs, gallops or rubs.  Abdomen: Soft, no mass or tenderness. No organomegaly or hernia. Bowel sounds present.  Genitourinary and rectal: deferred  Extremities: No cyanosis, clubbing, or edema.  Neurologic: No focal sensory or motor deficits are noted. Grossly intact.  Psychiatric: Awake, alert an doriented x 3. Appropriate mood and affect.  Skin: Warm, dry and well perfused. No lesions, nodules or rashes are noted.    PAST MEDICAL HISTORY:  Past Medical History:   Diagnosis Date   ??? A-fib (HCC)    ??? Hypertension        PAST SURGICAL HISTORY:  No past surgical history on file.    FAMILY HISTORY:  Family History   Problem Relation Age of Onset   ??? Heart Disease Other        SOCIAL HISTORY:  Social History     Socioeconomic History   ??? Marital status: SINGLE     Spouse name: Not on file   ??? Number of children: Not on file   ??? Years of education: Not on file   ??? Highest education level: Not on file   Tobacco Use   ??? Smoking status: Current Some Day Smoker     Packs/day: 0.50     Years: 30.00  Pack years: 15.00     Last attempt to quit: 07/13/2017     Years since quitting: 0.3   ??? Smokeless tobacco: Never Used   Substance and Sexual Activity   ??? Alcohol use: Yes     Alcohol/week: 24.0 standard drinks     Types: 24 Cans of beer per week   ??? Drug use: Never   ??? Sexual activity: Not Currently     Partners: Female       LABORATORY RESULTS:   No flowsheet data found.  Lab Results   Component Value Date/Time    TSH 1.22 08/06/2017 08:20 PM       ALLERGY:  No Known Allergies     CURRENT MEDICATIONS:    Current Outpatient Medications:   ???  carbamide peroxide (DEBROX) 6.5 % otic solution, Administer 5 Drops into each ear two (2) times a day for 4 days., Disp: 5 mL, Rfl: 0  ???  lisinopril (PRINIVIL, ZESTRIL) 2.5 mg tablet, Take 1 Tab by mouth daily., Disp: 30 Tab, Rfl: 3  ???  amiodarone (CORDARONE) 200 mg tablet, Take 0.5 Tabs  by mouth daily., Disp: 30 Tab, Rfl: 1  ???  apixaban (ELIQUIS) 5 mg tablet, Take 1 Tab by mouth two (2) times a day. (Patient taking differently: Take 5 mg by mouth two (2) times a day. Indications: pt states he is not taking), Disp: 60 Tab, Rfl: 0  ???  aspirin 81 mg chewable tablet, Take 1 Tab by mouth daily. (Patient taking differently: Take 81 mg by mouth daily. Indications: pt states he is not taking), Disp: 30 Tab, Rfl: 0  ???  metoprolol succinate (TOPROL-XL) 25 mg XL tablet, Take 1 Tab by mouth daily. (Patient taking differently: Take 25 mg by mouth daily. Indications: pt states he is not taking), Disp: 30 Tab, Rfl: 0    Thank you for your referral and allowing me to participate in this patient's care.    Antony ContrasKatherine Kalijah Zeiss, MD PhD  [facility]  [facility]  [address], [address]  Phone: [phone]  Fax: [phone]

## 2017-12-06 NOTE — Telephone Encounter (Signed)
LVM for patient to schedule follow up visit

## 2017-12-08 LAB — GAMMOPATHY EVAL, SPEP/IFE, IG QT/FLC
A/G RATIO, 149531: 1.4 NA (ref 0.7–1.7)
A/G ratio: 1.4 (ref 0.7–1.7)
ALBUMIN, 149520: 4.1 g/dL (ref 2.9–4.4)
ALPHA-2-GLOBULIN: 0.7 g/dL (ref 0.4–1.0)
Albumin: 4.1 g/dL (ref 2.9–4.4)
Alpha-1-Globulin: 0.2 g/dL (ref 0.0–0.4)
Alpha-1-globulin: 0.2 g/dL (ref 0.0–0.4)
Alpha-2-Globulin: 0.7 g/dL (ref 0.4–1.0)
BETA GLOBULIN, 149523: 1 g/dL (ref 0.7–1.3)
Beta Globulin: 1 g/dL (ref 0.7–1.3)
FREE KAPPA LT CHAINS, SERUM: 37.6 mg/L — ABNORMAL HIGH (ref 3.3–19.4)
FREE LAMBDA LT CHAINS, SERUM: 23 mg/L (ref 5.7–26.3)
Free Kappa Lt Chains, serum: 37.6 mg/L — ABNORMAL HIGH (ref 3.3–19.4)
Free Lambda Lt Chains, serum: 23 mg/L (ref 5.7–26.3)
GAMMA GLOBULIN, 149524: 1.2 g/dL (ref 0.4–1.8)
GLOBULIN, TOTAL: 3.1 g/dL (ref 2.2–3.9)
Gamma Globulin: 1.2 g/dL (ref 0.4–1.8)
Globulin, total: 3.1 g/dL (ref 2.2–3.9)
Immunoglobulin A, Qt.: 204 mg/dL (ref 90–386)
Immunoglobulin A, Qt.: 204 mg/dL (ref 90–386)
Immunoglobulin G, Qt.: 1307 mg/dL (ref 700–1600)
Immunoglobulin G, Quantitative: 1307 mg/dL (ref 700–1600)
Immunoglobulin M, Qt.: 54 mg/dL (ref 20–172)
Immunoglobulin M, Quantitative: 54 mg/dL (ref 20–172)
KAPPA/LAMBDA RATIO, SERUM: 1.63 NA (ref 0.26–1.65)
Kappa/Lambda ratio, serum: 1.63 (ref 0.26–1.65)
Protein, total: 7.2 g/dL (ref 6.0–8.5)
Total Protein: 7.2 g/dL (ref 6.0–8.5)

## 2017-12-08 LAB — BASIC METABOLIC PANEL
BUN: 13 mg/dL (ref 6–24)
Bun/Cre Ratio: 15 NA (ref 9–20)
CO2: 19 mmol/L — ABNORMAL LOW (ref 20–29)
Calcium: 9.5 mg/dL (ref 8.7–10.2)
Chloride: 103 mmol/L (ref 96–106)
Creatinine: 0.88 mg/dL (ref 0.76–1.27)
EGFR IF NonAfrican American: 95 mL/min/{1.73_m2} (ref >59)
GFR African American: 109 mL/min/{1.73_m2} (ref >59)
Glucose: 88 mg/dL (ref 65–99)
Potassium: 4.3 mmol/L (ref 3.5–5.2)
Sodium: 139 mmol/L (ref 134–144)

## 2017-12-08 LAB — CBC
Hematocrit: 41.2 % (ref 37.5–51.0)
Hemoglobin: 14.6 g/dL (ref 13.0–17.7)
MCH: 33.5 pg — ABNORMAL HIGH (ref 26.6–33.0)
MCHC: 35.4 g/dL (ref 31.5–35.7)
MCV: 95 fL (ref 79–97)
Platelets: 204 10*3/uL (ref 150–450)
RBC: 4.36 x10E6/uL (ref 4.14–5.80)
RDW: 16.1 % — ABNORMAL HIGH (ref 12.3–15.4)
WBC: 5.5 10*3/uL (ref 3.4–10.8)

## 2017-12-08 LAB — HEPATIC FUNCTION PANEL
ALT (SGPT): 66 IU/L — ABNORMAL HIGH (ref 0–44)
ALT: 66 IU/L — ABNORMAL HIGH (ref 0–44)
AST (SGOT): 55 IU/L — ABNORMAL HIGH (ref 0–40)
AST: 55 IU/L — ABNORMAL HIGH (ref 0–40)
Albumin: 4.4 g/dL (ref 3.5–5.5)
Albumin: 4.4 g/dL (ref 3.5–5.5)
Alk. phosphatase: 138 IU/L — ABNORMAL HIGH (ref 39–117)
Alkaline Phosphatase: 138 IU/L — ABNORMAL HIGH (ref 39–117)
Bilirubin, Direct: 0.19 mg/dL (ref 0.00–0.40)
Bilirubin, direct: 0.19 mg/dL (ref 0.00–0.40)
Bilirubin, total: 0.5 mg/dL (ref 0.0–1.2)
Total Bilirubin: 0.5 mg/dL (ref 0.0–1.2)

## 2017-12-08 LAB — CK
Creatine Kinase,Total: 168 U/L (ref 24–204)
Total CK: 168 U/L (ref 24–204)

## 2017-12-08 LAB — LIPID PANEL
Cholesterol, Total: 161 mg/dL (ref 100–199)
Cholesterol, total: 161 mg/dL (ref 100–199)
HDL Cholesterol: 64 mg/dL (ref >39)
HDL: 64 mg/dL (ref >39)
LDL Calculated: 83 mg/dL (ref 0–99)
LDL, calculated: 83 mg/dL (ref 0–99)
Triglyceride: 69 mg/dL (ref 0–149)
Triglycerides: 69 mg/dL (ref 0–149)
VLDL Cholesterol Calculated: 14 mg/dL (ref 5–40)
VLDL, calculated: 14 mg/dL (ref 5–40)

## 2017-12-08 LAB — IRON AND TIBC
Iron Saturation: 32 % (ref 15–55)
Iron: 110 ug/dL (ref 38–169)
TIBC: 348 ug/dL (ref 250–450)
UIBC: 238 ug/dL (ref 111–343)

## 2017-12-08 LAB — HIV 1/2 ANTIGEN/ANTIBODY, FOURTH GENERATION W/RFL: HIV Screen 4th Generation wRfx: NONREACTIVE

## 2017-12-08 LAB — VITAMIN D 25 HYDROXY: Vit D, 25-Hydroxy: 36.2 ng/mL (ref 30.0–100.0)

## 2017-12-08 LAB — METABOLIC PANEL, BASIC
BUN/Creatinine ratio: 15 (ref 9–20)
BUN: 13 mg/dL (ref 6–24)
CO2: 19 mmol/L — ABNORMAL LOW (ref 20–29)
Calcium: 9.5 mg/dL (ref 8.7–10.2)
Chloride: 103 mmol/L (ref 96–106)
Creatinine: 0.88 mg/dL (ref 0.76–1.27)
GFR est AA: 109 mL/min/{1.73_m2} (ref >59)
GFR est non-AA: 95 mL/min/{1.73_m2} (ref >59)
Glucose: 88 mg/dL (ref 65–99)
Potassium: 4.3 mmol/L (ref 3.5–5.2)
Sodium: 139 mmol/L (ref 134–144)

## 2017-12-08 LAB — CBC W/O DIFF
HCT: 41.2 % (ref 37.5–51.0)
HGB: 14.6 g/dL (ref 13.0–17.7)
MCH: 33.5 pg — ABNORMAL HIGH (ref 26.6–33.0)
MCHC: 35.4 g/dL (ref 31.5–35.7)
MCV: 95 fL (ref 79–97)
PLATELET: 204 10*3/uL (ref 150–450)
RBC: 4.36 x10E6/uL (ref 4.14–5.80)
RDW: 16.1 % — ABNORMAL HIGH (ref 12.3–15.4)
WBC: 5.5 10*3/uL (ref 3.4–10.8)

## 2017-12-08 LAB — IRON PROFILE
Iron % saturation: 32 % (ref 15–55)
Iron: 110 ug/dL (ref 38–169)
TIBC: 348 ug/dL (ref 250–450)
UIBC: 238 ug/dL (ref 111–343)

## 2017-12-08 LAB — VITAMIN D, 25 HYDROXY: VITAMIN D, 25-HYDROXY: 36.2 ng/mL (ref 30.0–100.0)

## 2017-12-08 LAB — HIV 1/2 AG/AB, 4TH GENERATION,W RFLX CONFIRM: HIV SCREEN 4TH GENERATION WRFX: NONREACTIVE

## 2018-01-16 ENCOUNTER — Ambulatory Visit
Admit: 2018-01-16 | Payer: PRIVATE HEALTH INSURANCE | Attending: Specialist | Primary: Student in an Organized Health Care Education/Training Program

## 2018-01-16 ENCOUNTER — Ambulatory Visit: Attending: Specialist | Primary: Student in an Organized Health Care Education/Training Program

## 2018-01-16 DIAGNOSIS — I4891 Unspecified atrial fibrillation: Secondary | ICD-10-CM

## 2018-01-16 MED ORDER — APIXABAN 5 MG TABLET
5 mg | ORAL_TABLET | Freq: Two times a day (BID) | ORAL | 4 refills | Status: DC
Start: 2018-01-16 — End: 2018-05-09

## 2018-01-16 NOTE — Progress Notes (Signed)
LAST OFFICE VISIT : 10/16/17        ICD-10-CM ICD-9-CM   1. Atrial fibrillation with RVR (HCC) I48.91 427.31   2. NICM (nonischemic cardiomyopathy) (HCC) I42.8 425.4   3. Systolic CHF, chronic (HCC) I50.22 428.22     428.0            Steve Green is a 57 y.o. male with Aflutter, ICM referred for follow up.      Cardiac risk factors: male gender  I have personally obtained the history from the patient.    HISTORY OF PRESENTING ILLNESS     Steve Green reports he is feeling well overall. He has had no more dizziness or weakness. He is active with no exertional sxs.     He has been eating healthy and clean. He is baking instead of frying.     He stopped Eliquis some time ago.     The patient denies chest pain/ shortness of breath, orthopnea, PND, LE edema, palpitations, syncope, presyncope or fatigue.         ACTIVE PROBLEM LIST     Patient Active Problem List    Diagnosis Date Noted   ??? Systolic CHF, chronic ([address]) 08/07/2017   ??? Atrial fibrillation with RVR ([place]) 08/06/2017   ??? Chest pain 08/06/2017   ??? Hyperglycemia 08/06/2017   ??? Elevated troponin 08/06/2017           PAST MEDICAL HISTORY     Past Medical History:   Diagnosis Date   ??? A-fib ([place])    ??? Hypertension            PAST SURGICAL HISTORY     No past surgical history on file.       ALLERGIES     No Known Allergies       FAMILY HISTORY     Family History   Problem Relation Age of Onset   ??? Heart Disease Other     negative for cardiac disease       SOCIAL HISTORY     Social History     Socioeconomic History   ??? Marital status: SINGLE     Spouse name: Not on file   ??? Number of children: Not on file   ??? Years of education: Not on file   ??? Highest education level: Not on file   Tobacco Use   ??? Smoking status: Current Some Day Smoker     Packs/day: 0.50     Years: 30.00     Pack years: 15.00     Last attempt to quit: 07/13/2017     Years since quitting: 0.5   ??? Smokeless tobacco: Never Used   Substance and Sexual Activity   ??? Alcohol use: Yes      Alcohol/week: 24.0 standard drinks     Types: 24 Cans of beer per week   ??? Drug use: Never   ??? Sexual activity: Not Currently     Partners: Female         MEDICATIONS     Current Outpatient Medications   Medication Sig   ??? apixaban (ELIQUIS) 5 mg tablet Take 1 Tab by mouth two (2) times a day.   ??? metoprolol succinate (TOPROL-XL) 25 mg XL tablet Take 0.5 Tabs by mouth daily.   ??? lisinopril (PRINIVIL, ZESTRIL) 2.5 mg tablet Take 1 Tab by mouth daily.   ??? amiodarone (CORDARONE) 200 mg tablet Take 0.5 Tabs by mouth daily.   ??? aspirin 81 mg  chewable tablet Take 1 Tab by mouth daily. (Patient not taking: Reported on 01/16/2018)     No current facility-administered medications for this visit.        I have reviewed the nurses notes, vitals, problem list, allergy list, medical history, family, social history and medications.       REVIEW OF SYMPTOMS      General: Pt denies excessive weight gain or loss. Pt is able to conduct ADL's  HEENT: Denies blurred vision, headaches, hearing loss, epistaxis and difficulty swallowing.  Respiratory: Denies cough, congestion, shortness of breath, DOE, wheezing or stridor.  Cardiovascular: Denies precordial pain, palpitations, edema or PND  Gastrointestinal: Denies poor appetite, indigestion, abdominal pain or blood in stool  Genitourinary: Denies hematuria, dysuria, increased urinary frequency  Musculoskeletal: Denies joint pain or swelling from muscles or joints  Neurologic: Denies tremor, paresthesias, headache, or sensory motor disturbance  Psychiatric: Denies confusion, insomnia, depression  Integumentray: Denies rash, itching or ulcers.  Hematologic: Denies easy bruising, bleeding     PHYSICAL EXAMINATION      Vitals:    01/16/18 1402   BP: 132/82   Pulse: 75   SpO2: 98%   Weight: 161 lb 3.2 oz (73.1 kg)   Height: '5\' 8"'  (1.727 m)     General: Well developed, in no acute distress.  HEENT: No jaundice, oral mucosa moist, no oral ulcers   Neck: Supple, no stiffness, no lymphadenopathy, supple  Heart:  Normal S1/S2 negative S3 or S4. Regular, no murmur, gallop or rub, no jugular venous distention  Respiratory: Clear bilaterally x 4, no wheezing or rales  Abdomen:   Soft, non-tender, bowel sounds are active.   Extremities:  No edema, normal cap refill, no cyanosis.  Musculoskeletal: No clubbing, no deformities  Neuro: A&Ox3, speech clear, gait stable, cooperative, no focal neurologic deficits  Skin: Skin color is normal. No rashes or lesions. Non diaphoretic, moist.  Vascular: 2+ pulses symmetric in all extremities         DIAGNOSTIC DATA     1. Cardiac Cath  08/09/17-  Normal Coronary artery anatomy??  PCWP 22??  C.O /C.I 5.69/3.06??  PA sat 73%    2. Echo  08/07/17-EF 16-20%, Severely dilated left ventricle, LAE mod, mild TV stenosis,   10/16/17: EF 39%, BAE, Moderate MR MV thickening. 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 Lipids  12/05/17- TC 161, HDL 64, LDL 83, TG 69         LABORATORY DATA            Lab Results   Component Value Date/Time    WBC 5.5 12/05/2017 12:00 AM    HGB 14.6 12/05/2017 12:00 AM    HCT 41.2 12/05/2017 12:00 AM    PLATELET 204 12/05/2017 12:00 AM    MCV 95 12/05/2017 12:00 AM      Lab Results   Component Value Date/Time    Sodium 139 12/05/2017 12:00 AM    Potassium 4.3 12/05/2017 12:00 AM    Chloride 103 12/05/2017 12:00 AM    CO2 19 (L) 12/05/2017 12:00 AM    Anion gap 5 08/10/2017 04:09 AM    Glucose 88 12/05/2017 12:00 AM    BUN 13 12/05/2017 12:00 AM    Creatinine 0.88 12/05/2017 12:00 AM    BUN/Creatinine ratio 15 12/05/2017 12:00 AM    GFR est AA 109 12/05/2017 12:00 AM    GFR est non-AA 95 12/05/2017 12:00 AM  Calcium 9.5 12/05/2017 12:00 AM    Bilirubin, total 0.5 12/05/2017 12:00 AM    AST (SGOT) 55 (H) 12/05/2017 12:00 AM    Alk. phosphatase 138 (H) 12/05/2017 12:00 AM    Protein, total 7.2 12/05/2017 12:00 AM    Albumin 4.4 12/05/2017 12:00 AM    Globulin 3.1 08/09/2017 03:45 AM     A-G Ratio 1.0 (L) 08/09/2017 03:45 AM    ALT (SGPT) 66 (H) 12/05/2017 12:00 AM           ASSESSMENT/RECOMMENDATIONS:.      1. Aflutter  - He is on Amiadarone 182m/d  - Will do surveillance labs on TSH in the future  - He had stopped Elqiuis after seeing Dr. LMathews Robinsonsin August. He had misinterpreted what she said. I will send out for a refill.      2. NICM  - He is NYHA 1   - Continue all current medications  - He is well compensated. Will return in 6 months for limited echo.  - His EF is 39%. Since this has improved, no need for AICD at this time.  - Continue low sodium diet.     3. Screening cholesterol   - Cholesterol at goal.     Return in 6 months or PRN.     Orders Placed This Encounter   ??? apixaban (ELIQUIS) 5 mg tablet     Sig: Take 1 Tab by mouth two (2) times a day.     Dispense:  60 Tab     Refill:  4          Follow-up and Dispositions  ??   Return in about 6 months (around 07/18/2018).           I have discussed the diagnosis with  JNatale Milchand the intended plan as seen in the above orders.  Questions were answered concerning future plans.  I have discussed medication side effects and warnings with the patient as well.    Thank you,  TForrest Moron MD for involving me in the care of  JNatale Green Please do not hesitate to contact me for further questions/concerns.     Written by CMaryan Rued as dictated by MVennie Homans MD.      MJeannette HowDoloresco,  MD, [facility]   Patient Care Team:  TForrest Moron MD as PCP - General ([facility]  Freja Faro, MElta Guadeloupe MD (Cardiology)    [facility]     [address]    [place]     [phone]/ [phone]

## 2018-01-16 NOTE — Progress Notes (Signed)
Steve Green is a 56 y.o. male    Chief Complaint   Patient presents with   ??? Cardiomyopathy   ??? CHF   ??? Irregular Heart Beat       Chest pain  No    SOB No    Dizziness No    Swelling No    Refills No    Visit Vitals  BP 132/82 (BP 1 Location: Left arm, BP Patient Position: Sitting)   Pulse 75   Ht 5' 8" (1.727 m)   Wt 161 lb 3.2 oz (73.1 kg)   SpO2 98%   BMI 24.51 kg/m??       1. Have you been to the ER, urgent care clinic since your last visit?  Hospitalized since your last visit? No    2. Have you seen or consulted any other health care providers outside of the [facility] since your last visit?  Include any pap smears or colon screening. No

## 2018-01-16 NOTE — Progress Notes (Signed)
Steve Green is a 57 y.o. male    Chief Complaint   Patient presents with   . Cardiomyopathy   . CHF   . Irregular Heart Beat       Chest pain  No    SOB No    Dizziness No    Swelling No    Refills No    Visit Vitals  BP 132/82 (BP 1 Location: Left arm, BP Patient Position: Sitting)   Pulse 75   Ht 5\' 8"  (1.727 m)   Wt 161 lb 3.2 oz (73.1 kg)   SpO2 98%   BMI 24.51 kg/m       1. Have you been to the ER, urgent care clinic since your last visit?  Hospitalized since your last visit? No    2. Have you seen or consulted any other health care providers outside of the [facility] since your last visit?  Include any pap smears or colon screening. No

## 2018-01-16 NOTE — Progress Notes (Signed)
Progress Notes by Vennie Homans, MD at 01/16/18 1400                Author: Vennie Homans, MD  Service: --  Author Type: Physician       Filed: 01/16/18 1457  Encounter Date: 01/16/2018  Status: Signed          Editor: Vennie Homans, MD (Physician)                       LAST OFFICE VISIT : 10/16/17                  ICD-10-CM  ICD-9-CM          1.  Atrial fibrillation with RVR (HCC)  I48.91  427.31     2.  NICM (nonischemic cardiomyopathy) (HCC)  I42.8  425.4     3.  Systolic CHF, chronic (HCC)  I50.22  428.22             428.0                  Steve Green is a 57 y.o.  male with Aflutter, ICM referred for follow up.        Cardiac risk factors: male gender   I have personally obtained the history from the patient.        HISTORY OF PRESENTING ILLNESS        Steve Green reports he is feeling well overall. He has had no more dizziness or weakness. He is active with no exertional sxs.       He has been eating healthy and clean. He is baking instead of frying.       He stopped Eliquis some time ago.       The patient denies chest pain/ shortness of breath, orthopnea, PND, LE edema, palpitations, syncope, presyncope or fatigue.              ACTIVE PROBLEM LIST          Patient Active Problem List           Diagnosis  Date Noted         ?  Systolic CHF, chronic ([place])  08/07/2017     ?  Atrial fibrillation with RVR ([place])  08/06/2017     ?  Chest pain  08/06/2017     ?  Hyperglycemia  08/06/2017         ?  Elevated troponin  08/06/2017                  PAST MEDICAL HISTORY          Past Medical History:        Diagnosis  Date         ?  A-fib ([place])           ?  Hypertension                    PAST SURGICAL HISTORY        No past surgical history on file.            ALLERGIES        No Known Allergies            FAMILY HISTORY          Family History         Problem  Relation  Age of Onset          ?  Heart Disease  Other        negative for cardiac disease            SOCIAL HISTORY          Social History           Socioeconomic History         ?  Marital status:  SINGLE              Spouse name:  Not on file         ?  Number of children:  Not on file     ?  Years of education:  Not on file     ?  Highest education level:  Not on file       Tobacco Use         ?  Smoking status:  Current Some Day Smoker              Packs/day:  0.50         Years:  30.00         Pack years:  15.00         Last attempt to quit:  07/13/2017         Years since quitting:  0.5         ?  Smokeless tobacco:  Never Used       Substance and Sexual Activity         ?  Alcohol use:  Yes              Alcohol/week:  24.0 standard drinks         Types:  24 Cans of beer per week         ?  Drug use:  Never     ?  Sexual activity:  Not Currently              Partners:  Female                MEDICATIONS          Current Outpatient Medications        Medication  Sig         ?  apixaban (ELIQUIS) 5 mg tablet  Take 1 Tab by mouth two (2) times a day.     ?  metoprolol succinate (TOPROL-XL) 25 mg XL tablet  Take 0.5 Tabs by mouth daily.     ?  lisinopril (PRINIVIL, ZESTRIL) 2.5 mg tablet  Take 1 Tab by mouth daily.     ?  amiodarone (CORDARONE) 200 mg tablet  Take 0.5 Tabs by mouth daily.         ?  aspirin 81 mg chewable tablet  Take 1 Tab by mouth daily. (Patient not taking: Reported on 01/16/2018)          No current facility-administered medications for this visit.            I have reviewed the nurses notes, vitals, problem list, allergy list, medical history, family, social history and medications.            REVIEW OF SYMPTOMS         General: Pt denies excessive weight gain or loss. Pt is able to conduct ADL's   HEENT: Denies blurred vision, headaches, hearing loss, epistaxis and difficulty swallowing.   Respiratory: Denies cough, congestion, shortness of breath, DOE, wheezing or stridor.   Cardiovascular: Denies precordial pain, palpitations, edema or PND  Gastrointestinal: Denies poor appetite, indigestion, abdominal pain or blood in stool    Genitourinary: Denies hematuria, dysuria, increased urinary frequency   Musculoskeletal: Denies joint pain or swelling from muscles or joints   Neurologic: Denies tremor, paresthesias, headache, or sensory motor disturbance   Psychiatric: Denies confusion, insomnia, depression   Integumentray: Denies rash, itching or ulcers.   Hematologic: Denies easy bruising, bleeding         PHYSICAL EXAMINATION           Vitals:          01/16/18 1402        BP:  132/82     Pulse:  75     SpO2:  98%     Weight:  161 lb 3.2 oz (73.1 kg)        Height:  '5\' 8"'  (1.727 m)        General: Well developed, in no acute distress.   HEENT: No jaundice, oral mucosa moist, no oral ulcers   Neck: Supple, no stiffness, no lymphadenopathy, supple   Heart:  Normal S1/S2 negative S3 or S4. Regular, no murmur, gallop or rub, no jugular venous distention   Respiratory: Clear bilaterally x 4, no wheezing or rales   Abdomen:   Soft, non-tender, bowel sounds are active.    Extremities:  No edema, normal cap refill, no cyanosis.   Musculoskeletal: No clubbing, no deformities   Neuro: A&Ox3, speech clear, gait stable, cooperative, no focal neurologic deficits   Skin: Skin color is normal. No rashes or lesions. Non diaphoretic, moist.   Vascular: 2+ pulses symmetric in all extremities               DIAGNOSTIC DATA        1. Cardiac Cath   08/09/17-   Normal Coronary artery anatomy??   PCWP 22??   C.O /C.I 5.69/3.06??   PA sat 73%      2. Echo   08/07/17-EF 16-20%, Severely dilated left ventricle, LAE mod, mild TV stenosis,    10/16/17: EF 39%, BAE, Moderate MR MV thickening. 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 Lipids   12/05/17- TC 161, HDL 64, LDL 83, TG 69               LABORATORY DATA                    Lab Results         Component  Value  Date/Time            WBC  5.5  12/05/2017 12:00 AM       HGB  14.6  12/05/2017 12:00 AM       HCT  41.2  12/05/2017 12:00 AM       PLATELET  204  12/05/2017 12:00 AM            MCV  95  12/05/2017  12:00 AM           Lab Results         Component  Value  Date/Time            Sodium  139  12/05/2017 12:00 AM       Potassium  4.3  12/05/2017 12:00 AM       Chloride  103  12/05/2017 12:00 AM       CO2  19 (L)  12/05/2017 12:00 AM  Anion gap  5  08/10/2017 04:09 AM       Glucose  88  12/05/2017 12:00 AM       BUN  13  12/05/2017 12:00 AM       Creatinine  0.88  12/05/2017 12:00 AM       BUN/Creatinine ratio  15  12/05/2017 12:00 AM       GFR est AA  109  12/05/2017 12:00 AM       GFR est non-AA  95  12/05/2017 12:00 AM       Calcium  9.5  12/05/2017 12:00 AM       Bilirubin, total  0.5  12/05/2017 12:00 AM       AST (SGOT)  55 (H)  12/05/2017 12:00 AM       Alk. phosphatase  138 (H)  12/05/2017 12:00 AM       Protein, total  7.2  12/05/2017 12:00 AM       Albumin  4.4  12/05/2017 12:00 AM       Globulin  3.1  08/09/2017 03:45 AM       A-G Ratio  1.0 (L)  08/09/2017 03:45 AM            ALT (SGPT)  66 (H)  12/05/2017 12:00 AM                  ASSESSMENT/RECOMMENDATIONS:.         1. Aflutter   - He is on Amiadarone 153m/d   - Will do surveillance labs on TSH in the future   - He had stopped Elqiuis after seeing Dr. LMathews Robinsonsin August. He had misinterpreted what she said. I will send out for a refill.        2. NICM   - He is NYHA 1    - Continue all current medications   - He is well compensated. Will return in 6 months for limited echo.   - His EF is 39%. Since this has improved, no need for AICD at this time.   - Continue low sodium diet.       3. Screening cholesterol    - Cholesterol at goal.       Return in 6 months or PRN.         Orders Placed This Encounter        ?  apixaban (ELIQUIS) 5 mg tablet             Sig: Take 1 Tab by mouth two (2) times a day.         Dispense:  60 Tab             Refill:  4                 Follow-up and Dispositions    ??       Return in about 6 months (around 07/18/2018).                    I have discussed the diagnosis with  JNatale Milchand the intended plan  as seen in the  above orders.  Questions were answered concerning future plans.  I have discussed medication side effects and warnings with the patient as well.      Thank you,  TForrest Moron MD for involving me in the care of  JNatale Green. Please do not hesitate to contact me for further questions/concerns.  Written by Maryan Rued, as dictated by Vennie Homans, MD.        Steve How.Zolton Dowson,  MD, [facility]      Patient Care Team:   Forrest Moron, MD as PCP - General [facility])   Xhaiden Coombs, Elta Guadeloupe, MD (Cardiology)      [facility]       [place], [place]      [place], [place]       [phone] / [phone] Fax

## 2018-03-04 ENCOUNTER — Ambulatory Visit
Admit: 2018-03-04 | Discharge: 2018-03-04 | Payer: PRIVATE HEALTH INSURANCE | Attending: Cardiovascular Disease | Primary: Student in an Organized Health Care Education/Training Program

## 2018-03-04 ENCOUNTER — Ambulatory Visit: Attending: Cardiovascular Disease | Primary: Student in an Organized Health Care Education/Training Program

## 2018-03-04 DIAGNOSIS — I4891 Unspecified atrial fibrillation: Secondary | ICD-10-CM

## 2018-03-04 NOTE — Progress Notes (Signed)
HISTORY OF PRESENTING ILLNESS      Steve Green is a 57 y.o. male with nonischemic cardiomyopathy, LVEF 15-20% (echocardiogram 08/07/17), moderate MR, atrial flutter s/p DCCV (on amiodarone) referred to discuss atrial flutter and ICD therapy for primary prevention of sudden death. He is currently on guideline directed medical therapy. He is on eliquis for CVA risk reduction.   LV function has improved to 39%.       ACTIVE PROBLEM LIST     Patient Active Problem List    Diagnosis Date Noted   ??? Systolic CHF, chronic ([place]) 08/07/2017   ??? Atrial fibrillation with RVR ([place]) 08/06/2017   ??? Chest pain 08/06/2017   ??? Hyperglycemia 08/06/2017   ??? Elevated troponin 08/06/2017           PAST MEDICAL HISTORY     Past Medical History:   Diagnosis Date   ??? A-fib ([place])    ??? Hypertension            PAST SURGICAL HISTORY     History reviewed. No pertinent surgical history.       ALLERGIES     No Known Allergies       FAMILY HISTORY     Family History   Problem Relation Age of Onset   ??? Heart Disease Other     negative for cardiac disease       SOCIAL HISTORY     Social History     Socioeconomic History   ??? Marital status: SINGLE     Spouse name: Not on file   ??? Number of children: Not on file   ??? Years of education: Not on file   ??? Highest education level: Not on file   Tobacco Use   ??? Smoking status: Current Some Day Smoker     Packs/day: 0.50     Years: 30.00     Pack years: 15.00     Last attempt to quit: 07/13/2017     Years since quitting: 0.6   ??? Smokeless tobacco: Never Used   Substance and Sexual Activity   ??? Alcohol use: Yes     Alcohol/week: 24.0 standard drinks     Types: 24 Cans of beer per week   ??? Drug use: Never   ??? Sexual activity: Not Currently     Partners: Female         MEDICATIONS     Current Outpatient Medications   Medication Sig   ??? apixaban (ELIQUIS) 5 mg tablet Take 1 Tab by mouth two (2) times a day.   ??? metoprolol succinate (TOPROL-XL) 25 mg XL tablet Take 0.5 Tabs by mouth daily.    ??? lisinopril (PRINIVIL, ZESTRIL) 2.5 mg tablet Take 1 Tab by mouth daily.   ??? amiodarone (CORDARONE) 200 mg tablet Take 0.5 Tabs by mouth daily.     No current facility-administered medications for this visit.        I have reviewed the nurses notes, vitals, problem list, allergy list, medical history, family, social history and medications.       REVIEW OF SYMPTOMS      General: Pt denies excessive weight gain or loss. Pt is able to conduct ADL's  HEENT: Denies blurred vision, headaches, hearing loss, epistaxis and difficulty swallowing.  Respiratory: Denies cough, congestion, shortness of breath, DOE, wheezing or stridor.  Cardiovascular: Denies precordial pain, palpitations, edema or PND  Gastrointestinal: Denies poor appetite, indigestion, abdominal pain or blood in stool  Genitourinary: Denies hematuria, dysuria,  increased urinary frequency  Musculoskeletal: Denies joint pain or swelling from muscles or joints  Neurologic: Denies tremor, paresthesias, headache, or sensory motor disturbance  Psychiatric: Denies confusion, insomnia, depression  Integumentray: Denies rash, itching or ulcers.  Hematologic: Denies easy bruising, bleeding       PHYSICAL EXAMINATION      Vitals:    03/04/18 1035   BP: 132/70   Pulse: 68   Resp: 18   SpO2: 94%   Weight: 156 lb (70.8 kg)   Height: '5\' 8"'  (1.727 m)     General: Well developed, in no acute distress.  HEENT: No jaundice, oral mucosa moist, no oral ulcers  Neck: Supple, no stiffness, no lymphadenopathy, supple  Heart: ??Normal S1/S2 negative S3 or S4. Regular, no murmur, gallop or rub, no jugular venous distention  Respiratory: Clear bilaterally x 4, no wheezing or rales  Abdomen:?? ??Soft, non-tender, bowel sounds are active.??  Extremities:  No edema, normal cap refill, no cyanosis.  Musculoskeletal: No clubbing, no deformities  Neuro: A&Ox3, speech clear, gait stable, cooperative, no focal neurologic deficits   Skin: Skin color is normal. No rashes or lesions. Non diaphoretic, moist.  Vascular: 2+ pulses symmetric in all extremities       DIAGNOSTIC DATA      EKG:        LABORATORY DATA      Lab Results   Component Value Date/Time    WBC 5.5 12/05/2017 12:00 AM    HGB 14.6 12/05/2017 12:00 AM    HCT 41.2 12/05/2017 12:00 AM    PLATELET 204 12/05/2017 12:00 AM    MCV 95 12/05/2017 12:00 AM      Lab Results   Component Value Date/Time    Sodium 139 12/05/2017 12:00 AM    Potassium 4.3 12/05/2017 12:00 AM    Chloride 103 12/05/2017 12:00 AM    CO2 19 (L) 12/05/2017 12:00 AM    Anion gap 5 08/10/2017 04:09 AM    Glucose 88 12/05/2017 12:00 AM    BUN 13 12/05/2017 12:00 AM    Creatinine 0.88 12/05/2017 12:00 AM    BUN/Creatinine ratio 15 12/05/2017 12:00 AM    GFR est AA 109 12/05/2017 12:00 AM    GFR est non-AA 95 12/05/2017 12:00 AM    Calcium 9.5 12/05/2017 12:00 AM    Bilirubin, total 0.5 12/05/2017 12:00 AM    AST (SGOT) 55 (H) 12/05/2017 12:00 AM    Alk. phosphatase 138 (H) 12/05/2017 12:00 AM    Protein, total 7.2 12/05/2017 12:00 AM    Albumin 4.4 12/05/2017 12:00 AM    Globulin 3.1 08/09/2017 03:45 AM    A-G Ratio 1.0 (L) 08/09/2017 03:45 AM    ALT (SGPT) 66 (H) 12/05/2017 12:00 AM           ASSESSMENT      1. Cardiomyopathy              A. NICM              B. LVEF 20-25%              C. NYHA II              D. QRS duration 96 msec  2. Atrial flutter              A. DCCV              B. Amiodarone  3. Mitral regurgitation  A. Moderate  ??       PLAN     Defer ICD due to recovery of LV function however will plan for atrial flutter ablation to avoid continued long-term exposure to amiodarone.  We will plan to discontinue Eliquis post procedure and monitor for atrial fibrillation.  Hold Eliquis 2 days prior.  Plan for procedure with conscious sedation.       FOLLOW-UP     2 weeks post procedure [name]      Thank you, Forrest Moron, MD for allowing me to participate in the care of  this extraordinarily pleasant male. Please do not hesitate to contact me for further questions/concerns.         Candis Schatz, MD  Cardiac Electrophysiology / Cardiology    [facility]  [address]  [place], [place]    [place], [place]  [phone] / [phone] Fax   [phone] / [phone] Fax

## 2018-03-04 NOTE — Progress Notes (Signed)
Room # 4  Visit Vitals  BP 132/70 (BP 1 Location: Right arm, BP Patient Position: Supine)   Pulse 68   Resp 18   Ht 5' 8" (1.727 m)   Wt 156 lb (70.8 kg)   SpO2 94%   BMI 23.72 kg/m??     No Cardiac Complaints  Wants clearance to go back to sport : football, basketball

## 2018-03-04 NOTE — Progress Notes (Signed)
Progress  Notes by Gailen Shelter, MD at 03/04/18 1020                Author: Gailen Shelter, MD  Service: --  Author Type: Physician       Filed: 03/08/18 1319  Encounter Date: 03/04/2018  Status: Signed          Editor: Gailen Shelter, MD (Physician)                                  HISTORY OF PRESENTING ILLNESS         Steve Green is a 57 y.o.  male with nonischemic cardiomyopathy, LVEF 15-20% (echocardiogram 08/07/17), moderate MR, atrial flutter s/p DCCV (on amiodarone) referred to  discuss atrial flutter and ICD therapy for primary prevention of sudden death. He is currently on guideline directed medical therapy. He is on eliquis for CVA risk reduction.    LV function has improved to 39%.            ACTIVE PROBLEM LIST          Patient Active Problem List           Diagnosis  Date Noted         ?  Systolic CHF, chronic ([place])  08/07/2017     ?  Atrial fibrillation with RVR ([place])  08/06/2017     ?  Chest pain  08/06/2017     ?  Hyperglycemia  08/06/2017         ?  Elevated troponin  08/06/2017                  PAST MEDICAL HISTORY          Past Medical History:        Diagnosis  Date         ?  A-fib ([place])           ?  Hypertension                    PAST SURGICAL HISTORY        History reviewed. No pertinent surgical history.            ALLERGIES        No Known Allergies            FAMILY HISTORY          Family History         Problem  Relation  Age of Onset          ?  Heart Disease  Other        negative for cardiac disease            SOCIAL HISTORY          Social History          Socioeconomic History         ?  Marital status:  SINGLE              Spouse name:  Not on file         ?  Number of children:  Not on file     ?  Years of education:  Not on file     ?  Highest education level:  Not on file       Tobacco Use         ?  Smoking  status:  Current Some Day Smoker              Packs/day:  0.50         Years:  30.00         Pack years:  15.00         Last attempt to quit:  07/13/2017         Years  since quitting:  0.6         ?  Smokeless tobacco:  Never Used       Substance and Sexual Activity         ?  Alcohol use:  Yes              Alcohol/week:  24.0 standard drinks         Types:  24 Cans of beer per week         ?  Drug use:  Never     ?  Sexual activity:  Not Currently              Partners:  Female                MEDICATIONS          Current Outpatient Medications        Medication  Sig         ?  apixaban (ELIQUIS) 5 mg tablet  Take 1 Tab by mouth two (2) times a day.     ?  metoprolol succinate (TOPROL-XL) 25 mg XL tablet  Take 0.5 Tabs by mouth daily.     ?  lisinopril (PRINIVIL, ZESTRIL) 2.5 mg tablet  Take 1 Tab by mouth daily.         ?  amiodarone (CORDARONE) 200 mg tablet  Take 0.5 Tabs by mouth daily.          No current facility-administered medications for this visit.            I have reviewed the nurses notes, vitals, problem list, allergy list, medical history, family, social history and medications.            REVIEW OF SYMPTOMS         General: Pt denies excessive weight gain or loss. Pt is able to conduct ADL's   HEENT: Denies blurred vision, headaches, hearing loss, epistaxis and difficulty swallowing.   Respiratory: Denies cough, congestion, shortness of breath, DOE, wheezing or stridor.   Cardiovascular: Denies precordial pain, palpitations, edema or PND   Gastrointestinal: Denies poor appetite, indigestion, abdominal pain or blood in stool   Genitourinary: Denies hematuria, dysuria, increased urinary frequency   Musculoskeletal: Denies joint pain or swelling from muscles or joints   Neurologic: Denies tremor, paresthesias, headache, or sensory motor disturbance   Psychiatric: Denies confusion, insomnia, depression   Integumentray: Denies rash, itching or ulcers.   Hematologic: Denies easy bruising, bleeding            PHYSICAL EXAMINATION           Vitals:          03/04/18 1035        BP:  132/70     Pulse:  68     Resp:  18     SpO2:  94%     Weight:  156 lb (70.8 kg)         Height:  '5\' 8"'  (1.727 m)        General: Well developed, in no acute distress.  HEENT: No jaundice, oral mucosa moist, no oral ulcers   Neck: Supple, no stiffness, no lymphadenopathy, supple   Heart: ??Normal S1/S2 negative S3 or S4. Regular, no murmur, gallop or rub, no jugular venous distention   Respiratory: Clear bilaterally x 4, no wheezing or rales   Abdomen:?? ??Soft, non-tender, bowel sounds are active.??   Extremities:  No edema, normal cap refill, no cyanosis.   Musculoskeletal: No clubbing, no deformities   Neuro: A&Ox3, speech clear, gait stable, cooperative, no focal neurologic deficits   Skin: Skin color is normal. No rashes or lesions. Non diaphoretic, moist.   Vascular: 2+ pulses symmetric in all extremities            DIAGNOSTIC DATA         EKG:             LABORATORY DATA           Lab Results         Component  Value  Date/Time            WBC  5.5  12/05/2017 12:00 AM       HGB  14.6  12/05/2017 12:00 AM       HCT  41.2  12/05/2017 12:00 AM       PLATELET  204  12/05/2017 12:00 AM            MCV  95  12/05/2017 12:00 AM           Lab Results         Component  Value  Date/Time            Sodium  139  12/05/2017 12:00 AM       Potassium  4.3  12/05/2017 12:00 AM       Chloride  103  12/05/2017 12:00 AM       CO2  19 (L)  12/05/2017 12:00 AM       Anion gap  5  08/10/2017 04:09 AM       Glucose  88  12/05/2017 12:00 AM       BUN  13  12/05/2017 12:00 AM       Creatinine  0.88  12/05/2017 12:00 AM       BUN/Creatinine ratio  15  12/05/2017 12:00 AM       GFR est AA  109  12/05/2017 12:00 AM       GFR est non-AA  95  12/05/2017 12:00 AM       Calcium  9.5  12/05/2017 12:00 AM       Bilirubin, total  0.5  12/05/2017 12:00 AM       AST (SGOT)  55 (H)  12/05/2017 12:00 AM       Alk. phosphatase  138 (H)  12/05/2017 12:00 AM       Protein, total  7.2  12/05/2017 12:00 AM       Albumin  4.4  12/05/2017 12:00 AM       Globulin  3.1  08/09/2017 03:45 AM       A-G Ratio  1.0 (L)  08/09/2017 03:45 AM             ALT (SGPT)  66 (H)  12/05/2017 12:00 AM                  ASSESSMENT         1. Cardiomyopathy  A. NICM               B. LVEF 20-25%               C. NYHA II               D. QRS duration 96 msec   2. Atrial flutter               A. DCCV               B. Amiodarone   3. Mitral regurgitation               A. Moderate   ??            PLAN        Defer ICD due to recovery of LV function however will plan for atrial flutter ablation to avoid continued long-term exposure to amiodarone.  We will plan to discontinue Eliquis post procedure and monitor for atrial fibrillation.  Hold Eliquis 2 days prior.   Plan for procedure with conscious sedation.            FOLLOW-UP        2 weeks post procedure [name]         Thank you, Forrest Moron, MD for allowing me to participate in the care of this extraordinarily pleasant  male. Please do not hesitate to contact me for further questions/concerns.             Candis Schatz, MD   Cardiac Electrophysiology / Cardiology      [facility]   [address], [address]   [place], [place]    [place], [place]   [phone] / [phone] Fax   [phone] / [phone] Fax

## 2018-03-04 NOTE — Progress Notes (Signed)
 Room # 4  Visit Vitals  BP 132/70 (BP 1 Location: Right arm, BP Patient Position: Supine)   Pulse 68   Resp 18   Ht 5' 8 (1.727 m)   Wt 156 lb (70.8 kg)   SpO2 94%   BMI 23.72 kg/m     No Cardiac Complaints  Wants clearance to go back to sport : football, basketball

## 2018-03-18 NOTE — Telephone Encounter (Signed)
Called patient to schedule atrial flutter ablation.  Home number not in service.  Cell message stated not available and to try call again later.

## 2018-03-23 ENCOUNTER — Encounter

## 2018-03-25 MED ORDER — METOPROLOL SUCCINATE SR 25 MG 24 HR TAB
25 mg | ORAL_TABLET | ORAL | 0 refills | Status: DC
Start: 2018-03-25 — End: 2018-04-24

## 2018-03-25 MED ORDER — AMIODARONE 200 MG TAB
200 mg | ORAL_TABLET | ORAL | 0 refills | Status: DC
Start: 2018-03-25 — End: 2018-04-24

## 2018-04-08 NOTE — Telephone Encounter (Signed)
Called patient, home # not in service. Left VM on cell # to have patient return call to [phone]. Calling to schedule patient for an Atrial Flutter Ablation.

## 2018-04-08 NOTE — Telephone Encounter (Signed)
Received call from patient, ID verified using two patient identifiers. Patient scheduled for Atrial Flutter Ablation on Friday 04/19/18 @ [facility] with an arrival time of 6:30 am.

## 2018-04-09 ENCOUNTER — Encounter

## 2018-04-18 NOTE — Telephone Encounter (Signed)
Received message from Burgess Estelle in the financial clearance house, auth for [employer]. Steve Green (04-Jul-1960) with [employer] for 04/19/18, but as of this afternoon, the case is still in medical review. They said they can take up to 10 business days to complete. Attempted to call patient, no answer, left VM to have him return call to office @ [phone]. Also called patient's sister (HIPAA verified) no answer, VM left to have her or patient return call to [phone]. Will need to reschedule his procedure until approved by insurance.

## 2018-04-18 NOTE — Telephone Encounter (Signed)
Called patient, no answer. Patient identified himself on voice mail message. Message left for patient that his Atrial Flutter ablation for tomorrow am @ [facility] will have to be cancelled due to waiting for authorization from insurance company. Procedure can be rescheduled for 05/09/2018 once authorization is received. Dr. Irving Burton notified.

## 2018-04-19 ENCOUNTER — Ambulatory Visit

## 2018-04-19 NOTE — Telephone Encounter (Signed)
Called patient, no answer. Left message to have him return call to [phone]. Also called Okey Regal, patient's sister, patent ID and HIPAA verified. Patient's Atrial Flutter Ablation rescheduled to Thursday 05/09/2018 @ [facility] with arrival time of 8 AM. Marylene Land will notify patient and contact our office with any questions or concerns.

## 2018-04-19 NOTE — Progress Notes (Signed)
Spoke with Mr. Elbert this morning about rescheduling his appointment. He asked that in the future please call his sister Angela Bailey at [phone] if he does not answer on his phone. He stated that he is more likely to receive messages this way.

## 2018-04-19 NOTE — Progress Notes (Signed)
Spoke with Steve Green this morning about rescheduling his appointment. He asked that in the future please call his sister Okey Regal at [phone] if he does not answer on his phone. He stated that he is more likely to receive messages this way.

## 2018-04-23 ENCOUNTER — Encounter

## 2018-04-24 MED ORDER — AMIODARONE 200 MG TAB
200 mg | ORAL_TABLET | ORAL | 0 refills | Status: DC
Start: 2018-04-24 — End: 2018-05-09

## 2018-04-24 MED ORDER — LISINOPRIL 2.5 MG TAB
2.5 mg | ORAL_TABLET | ORAL | 3 refills | Status: DC
Start: 2018-04-24 — End: 2018-08-05

## 2018-04-24 MED ORDER — METOPROLOL SUCCINATE SR 25 MG 24 HR TAB
25 mg | ORAL_TABLET | ORAL | 0 refills | Status: DC
Start: 2018-04-24 — End: 2018-07-08

## 2018-04-25 NOTE — Telephone Encounter (Signed)
Patient states that he is returning a call. Please advise.    Phone: [phone]

## 2018-04-25 NOTE — Telephone Encounter (Signed)
Returned call, no answer. Left VM to have patient return call to [phone]. Spoke with Okey Regal, patient's sister,on 04/18/2018 patient ID and HIPAA verified. Patient's Atrial Flutter Ablation rescheduled to Thursday 05/09/2018 @ [facility] with arrival time of 8 AM. Marylene Land will notify patient and contact our office with any questions or concerns.

## 2018-04-30 NOTE — Telephone Encounter (Signed)
Patient is returning your call. Please advise.    Phone #: [phone]  Thanks

## 2018-04-30 NOTE — Telephone Encounter (Signed)
Called patient, no answer. Left VM to have patient return call to [phone].

## 2018-04-30 NOTE — Telephone Encounter (Signed)
Called patient, ID verified using two patient identifiers. Confirmed date time and location of Atrial Flutter Ablation with patient. Patient verbalizes understanding of all information.

## 2018-05-06 NOTE — Telephone Encounter (Signed)
Patient is following up on the previous message. Please advise.    Phone #: [phone]  Thanks

## 2018-05-06 NOTE — Telephone Encounter (Signed)
Patient states that he has a scheduled procedure with Dr. Irving Burton on 1/30 and that he received a letter on Friday stating that he needed to have blood work done and he doe not think he will be able to have it done in time. Please advise.    Phone: [phone]

## 2018-05-06 NOTE — Telephone Encounter (Signed)
Returned call, ID verified using two patient identifiers. Patient states he is unable to get labs done prior to his procedure @ [facility] with Dr. Irving Burton on Thursday 05/09/2018. Advised patient to arrive @  7 am instead of 8 am and that labs can be drawn prior to his procedure. Patient  Agreeable to this plan and verbalizes understanding of all information.

## 2018-05-06 NOTE — Telephone Encounter (Signed)
Patient is calling to speka with you again. Please advise.    Phone:[phone]

## 2018-05-09 ENCOUNTER — Institutional Professional Consult (permissible substitution): Primary: Student in an Organized Health Care Education/Training Program

## 2018-05-09 ENCOUNTER — Inpatient Hospital Stay: Payer: PRIVATE HEALTH INSURANCE

## 2018-05-09 ENCOUNTER — Encounter

## 2018-05-09 LAB — CBC
Hematocrit: 37.9 % (ref 36.6–50.3)
Hemoglobin: 13.3 g/dL (ref 12.1–17.0)
MCH: 33.2 PG (ref 26.0–34.0)
MCHC: 35.1 g/dL (ref 30.0–36.5)
MCV: 94.5 FL (ref 80.0–99.0)
MPV: 11.1 FL (ref 8.9–12.9)
NRBC Absolute: 0 10*3/uL (ref 0.00–0.01)
Nucleated RBCs: 0 PER 100 WBC
Platelets: 194 10*3/uL (ref 150–400)
RBC: 4.01 M/uL — ABNORMAL LOW (ref 4.10–5.70)
RDW: 13.7 % (ref 11.5–14.5)
WBC: 5.8 10*3/uL (ref 4.1–11.1)

## 2018-05-09 LAB — BASIC METABOLIC PANEL
Anion Gap: 5 mmol/L (ref 5–15)
BUN: 13 MG/DL (ref 6–20)
Bun/Cre Ratio: 14 (ref 12–20)
CO2: 26 mmol/L (ref 21–32)
Calcium: 8.8 MG/DL (ref 8.5–10.1)
Chloride: 111 mmol/L — ABNORMAL HIGH (ref 97–108)
Creatinine: 0.91 MG/DL (ref 0.70–1.30)
EGFR IF NonAfrican American: >60 mL/min/{1.73_m2} (ref >60)
GFR African American: >60 mL/min/{1.73_m2} (ref >60)
Glucose: 96 mg/dL (ref 65–100)
Potassium: 3.9 mmol/L (ref 3.5–5.1)
Sodium: 142 mmol/L (ref 136–145)

## 2018-05-09 LAB — TYPE AND SCREEN
ABO/Rh: O POS
Antibody Screen: NEGATIVE

## 2018-05-09 LAB — CBC W/O DIFF
ABSOLUTE NRBC: 0 10*3/uL (ref 0.00–0.01)
HCT: 37.9 % (ref 36.6–50.3)
HGB: 13.3 g/dL (ref 12.1–17.0)
MCH: 33.2 PG (ref 26.0–34.0)
MCHC: 35.1 g/dL (ref 30.0–36.5)
MCV: 94.5 FL (ref 80.0–99.0)
MPV: 11.1 FL (ref 8.9–12.9)
NRBC: 0 PER 100 WBC
PLATELET: 194 10*3/uL (ref 150–400)
RBC: 4.01 M/uL — ABNORMAL LOW (ref 4.10–5.70)
RDW: 13.7 % (ref 11.5–14.5)
WBC: 5.8 10*3/uL (ref 4.1–11.1)

## 2018-05-09 LAB — TYPE & SCREEN
ABO/Rh(D): O POS
Antibody screen: NEGATIVE

## 2018-05-09 LAB — METABOLIC PANEL, BASIC
Anion gap: 5 mmol/L (ref 5–15)
BUN/Creatinine ratio: 14 (ref 12–20)
BUN: 13 MG/DL (ref 6–20)
CO2: 26 mmol/L (ref 21–32)
Calcium: 8.8 MG/DL (ref 8.5–10.1)
Chloride: 111 mmol/L — ABNORMAL HIGH (ref 97–108)
Creatinine: 0.91 MG/DL (ref 0.70–1.30)
GFR est AA: >60 mL/min/{1.73_m2} (ref >60)
GFR est non-AA: >60 mL/min/{1.73_m2} (ref >60)
Glucose: 96 mg/dL (ref 65–100)
Potassium: 3.9 mmol/L (ref 3.5–5.1)
Sodium: 142 mmol/L (ref 136–145)

## 2018-05-09 MED ORDER — ACETAMINOPHEN 325 MG TABLET
325 mg | ORAL | Status: DC | PRN
Start: 2018-05-09 — End: 2018-05-09

## 2018-05-09 MED ORDER — SODIUM CHLORIDE 0.9 % IJ SYRG
INTRAMUSCULAR | Status: DC | PRN
Start: 2018-05-09 — End: 2018-05-09

## 2018-05-09 MED ORDER — ONDANSETRON (PF) 4 MG/2 ML INJECTION
4 mg/2 mL | INTRAMUSCULAR | Status: DC | PRN
Start: 2018-05-09 — End: 2018-05-09

## 2018-05-09 MED ORDER — HYDROCODONE-ACETAMINOPHEN 5 MG-325 MG TAB
5-325 mg | ORAL | Status: DC | PRN
Start: 2018-05-09 — End: 2018-05-09

## 2018-05-09 MED ORDER — GLYCOPYRROLATE 0.2 MG/ML IJ SOLN
0.2 mg/mL | INTRAMUSCULAR | Status: DC | PRN
Start: 2018-05-09 — End: 2018-05-09
  Administered 2018-05-09 (×2): via INTRAVENOUS

## 2018-05-09 MED ORDER — DEXMEDETOMIDINE 100 MCG/ML IV SOLN
100 mcg/mL | INTRAVENOUS | Status: DC | PRN
Start: 2018-05-09 — End: 2018-05-09
  Administered 2018-05-09 (×3): via INTRAVENOUS

## 2018-05-09 MED ORDER — PROPOFOL 10 MG/ML IV EMUL
10 mg/mL | INTRAVENOUS | Status: DC | PRN
Start: 2018-05-09 — End: 2018-05-09
  Administered 2018-05-09 (×4): via INTRAVENOUS

## 2018-05-09 MED ORDER — SODIUM CHLORIDE 0.9 % IJ SYRG
Freq: Three times a day (TID) | INTRAMUSCULAR | Status: DC
Start: 2018-05-09 — End: 2018-05-09

## 2018-05-09 MED ORDER — HEPARIN (PORCINE) IN NS (PF) 1,000 UNIT/500 ML IV
1000 unit/500 mL | INTRAVENOUS | Status: AC
Start: 2018-05-09 — End: ?

## 2018-05-09 MED ORDER — SODIUM CHLORIDE 0.9 % IV
INTRAVENOUS | Status: DC | PRN
Start: 2018-05-09 — End: 2018-05-09
  Administered 2018-05-09 (×2): via INTRAVENOUS

## 2018-05-09 MED ORDER — SODIUM CHLORIDE 0.9 % IV
INTRAVENOUS | Status: AC
Start: 2018-05-09 — End: ?

## 2018-05-09 MED ORDER — ONDANSETRON (PF) 4 MG/2 ML INJECTION
4 mg/2 mL | INTRAMUSCULAR | Status: DC | PRN
Start: 2018-05-09 — End: 2018-05-09
  Administered 2018-05-09: 18:00:00 via INTRAVENOUS

## 2018-05-09 MED ORDER — HYDROCODONE-ACETAMINOPHEN 5 MG-325 MG TAB
5-325 mg | ORAL | Status: AC
Start: 2018-05-09 — End: ?

## 2018-05-09 MED ORDER — DEXAMETHASONE SODIUM PHOSPHATE 4 MG/ML IJ SOLN
4 mg/mL | INTRAMUSCULAR | Status: DC | PRN
Start: 2018-05-09 — End: 2018-05-09
  Administered 2018-05-09: 17:00:00 via INTRAVENOUS

## 2018-05-09 MED ORDER — PROPOFOL 10 MG/ML IV EMUL
10 mg/mL | INTRAVENOUS | Status: DC | PRN
Start: 2018-05-09 — End: 2018-05-09
  Administered 2018-05-09 (×3): via INTRAVENOUS

## 2018-05-09 MED ORDER — HYDROMORPHONE (PF) 1 MG/ML IJ SOLN
1 mg/mL | Freq: Once | INTRAMUSCULAR | Status: AC
Start: 2018-05-09 — End: 2018-05-09
  Administered 2018-05-09: 19:00:00 via INTRAVENOUS

## 2018-05-09 MED FILL — HEPARIN (PORCINE) IN NS (PF) 1,000 UNIT/500 ML IV: 1000 unit/500 mL | INTRAVENOUS | Qty: 1000

## 2018-05-09 MED FILL — HYDROMORPHONE (PF) 1 MG/ML IJ SOLN: 1 mg/mL | INTRAMUSCULAR | Qty: 0.5

## 2018-05-09 MED FILL — SODIUM CHLORIDE 0.9 % IV: INTRAVENOUS | Qty: 50

## 2018-05-09 MED FILL — HYDROCODONE-ACETAMINOPHEN 5 MG-325 MG TAB: 5-325 mg | ORAL | Qty: 1

## 2018-05-09 NOTE — Anesthesia Pre-Procedure Evaluation (Signed)
Relevant Problems   No relevant active problems       Anesthetic History   No history of anesthetic complications            Review of Systems / Medical History  Patient summary reviewed, nursing notes reviewed and pertinent labs reviewed    Pulmonary  Within defined limits                 Neuro/Psych   Within defined limits           Cardiovascular    Hypertension        Dysrhythmias            GI/Hepatic/Renal  Within defined limits              Endo/Other  Within defined limits           Other Findings              Physical Exam    Airway  Mallampati: II  TM Distance: > 6 cm  Neck ROM: normal range of motion   Mouth opening: Normal     Cardiovascular  Regular rate and rhythm,  S1 and S2 normal,  no murmur, click, rub, or gallop             Dental  No notable dental hx       Pulmonary  Breath sounds clear to auscultation               Abdominal  GI exam deferred       Other Findings            Anesthetic Plan    ASA: 2  Anesthesia type: MAC            Anesthetic plan and risks discussed with: Patient

## 2018-05-09 NOTE — Progress Notes (Signed)
Pt ambulated to bathroom with steady gait; no complaints of discomfort.

## 2018-05-09 NOTE — Progress Notes (Signed)
Pt medicated with Dilaudid 0.5 mg IVP for Chest burning pain 7/10 as per Dr Shams. Will monitor for relief. Dr Shams aware of elevated BP=178/112.

## 2018-05-09 NOTE — H&P (Signed)
??  ??  ??  HISTORY OF PRESENTING ILLNESS      Steve Green is a 58 y.o. male with nonischemic cardiomyopathy, LVEF 15-20% (echocardiogram 08/07/17), moderate MR, atrial flutter s/p DCCV (on amiodarone) referred to discuss atrial flutter and ICD therapy for primary prevention of sudden death. He is currently on guideline directed medical therapy. He is on eliquis for CVA risk reduction.??  LV function has improved to 39%.  ??  ??   ACTIVE PROBLEM LIST   ??       Patient Active Problem List   ?? Diagnosis Date Noted   ??? Systolic CHF, chronic ([place]) 08/07/2017   ??? Atrial fibrillation with RVR ([place]) 08/06/2017   ??? Chest pain 08/06/2017   ??? Hyperglycemia 08/06/2017   ??? Elevated troponin 08/06/2017      ??  ??   PAST MEDICAL HISTORY   ??       Past Medical History:   Diagnosis Date   ??? A-fib ([facility]) ??   ??? Hypertension ??      ??  ??   PAST SURGICAL HISTORY   ??  History reviewed. No pertinent surgical history.  ??  ??   ALLERGIES   ??  No Known Allergies   ??  ??  FAMILY HISTORY   ??        Family History   Problem Relation Age of Onset   ??? Heart Disease Other ??    negative for cardiac disease  ??  ??   SOCIAL HISTORY   ??  Social History      ??        Socioeconomic History   ??? Marital status: SINGLE   ?? ?? Spouse name: Not on file   ??? Number of children: Not on file   ??? Years of education: Not on file   ??? Highest education level: Not on file   Tobacco Use   ??? Smoking status: Current Some Day Smoker   ?? ?? Packs/day: 0.50   ?? ?? Years: 30.00   ?? ?? Pack years: 15.00   ?? ?? Last attempt to quit: 07/13/2017   ?? ?? Years since quitting: 0.6   ??? Smokeless tobacco: Never Used   Substance and Sexual Activity   ??? Alcohol use: Yes   ?? ?? Alcohol/week: 24.0 standard drinks   ?? ?? Types: 24 Cans of beer per week   ??? Drug use: Never   ??? Sexual activity: Not Currently   ?? ?? Partners: Female      ??  ??  ??  MEDICATIONS   ??       Current Outpatient Medications   Medication Sig   ??? apixaban (ELIQUIS) 5 mg tablet Take 1 Tab by mouth two (2) times a day.    ??? metoprolol succinate (TOPROL-XL) 25 mg XL tablet Take 0.5 Tabs by mouth daily.   ??? lisinopril (PRINIVIL, ZESTRIL) 2.5 mg tablet Take 1 Tab by mouth daily.   ??? amiodarone (CORDARONE) 200 mg tablet Take 0.5 Tabs by mouth daily.   ??  No current facility-administered medications for this visit.    ??  ??  I have reviewed the nurses notes, vitals, problem list, allergy list, medical history, family, social history and medications.  ??  ??   REVIEW OF SYMPTOMS      General: Pt denies excessive weight gain or loss. Pt is able to conduct ADL's  HEENT: Denies blurred vision, headaches, hearing loss, epistaxis and difficulty swallowing.  Respiratory: Denies cough, congestion, shortness of breath, DOE, wheezing or stridor.  Cardiovascular: Denies precordial pain, palpitations, edema or PND  Gastrointestinal: Denies poor appetite, indigestion, abdominal pain or blood in stool  Genitourinary: Denies hematuria, dysuria, increased urinary frequency  Musculoskeletal: Denies joint pain or swelling from muscles or joints  Neurologic: Denies tremor, paresthesias, headache, or sensory motor disturbance  Psychiatric: Denies confusion, insomnia, depression  Integumentray: Denies rash, itching or ulcers.  Hematologic: Denies easy bruising, bleeding  ??  ??   PHYSICAL EXAMINATION      Vitals:   ?? 03/04/18 1035   BP: 132/70   Pulse: 68   Resp: 18   SpO2: 94%   Weight: 156 lb (70.8 kg)   Height: '5\' 8"'  (1.727 m)   ??  General: Well developed, in no acute distress.  HEENT: No jaundice, oral mucosa moist, no oral ulcers  Neck: Supple, no stiffness, no lymphadenopathy, supple  Heart: ??Normal S1/S2 negative S3 or S4. Regular, no murmur, gallop or rub, no jugular venous distention  Respiratory: Clear bilaterally x 4, no wheezing or rales  Abdomen:?? ??Soft, non-tender, bowel sounds are active.??  Extremities:  No edema, normal cap refill, no cyanosis.  Musculoskeletal: No clubbing, no deformities   Neuro: A&Ox3, speech clear, gait stable, cooperative, no focal neurologic deficits  Skin: Skin color is normal. No rashes or lesions. Non diaphoretic, moist.  Vascular: 2+ pulses symmetric in all extremities  ??  ??   DIAGNOSTIC DATA      EKG:   ??  ??   LABORATORY DATA            Lab Results   Component Value Date/Time   ?? WBC 5.5 12/05/2017 12:00 AM   ?? HGB 14.6 12/05/2017 12:00 AM   ?? HCT 41.2 12/05/2017 12:00 AM   ?? PLATELET 204 12/05/2017 12:00 AM   ?? MCV 95 12/05/2017 12:00 AM            Lab Results   Component Value Date/Time   ?? Sodium 139 12/05/2017 12:00 AM   ?? Potassium 4.3 12/05/2017 12:00 AM   ?? Chloride 103 12/05/2017 12:00 AM   ?? CO2 19 (L) 12/05/2017 12:00 AM   ?? Anion gap 5 08/10/2017 04:09 AM   ?? Glucose 88 12/05/2017 12:00 AM   ?? BUN 13 12/05/2017 12:00 AM   ?? Creatinine 0.88 12/05/2017 12:00 AM   ?? BUN/Creatinine ratio 15 12/05/2017 12:00 AM   ?? GFR est AA 109 12/05/2017 12:00 AM   ?? GFR est non-AA 95 12/05/2017 12:00 AM   ?? Calcium 9.5 12/05/2017 12:00 AM   ?? Bilirubin, total 0.5 12/05/2017 12:00 AM   ?? AST (SGOT) 55 (H) 12/05/2017 12:00 AM   ?? Alk. phosphatase 138 (H) 12/05/2017 12:00 AM   ?? Protein, total 7.2 12/05/2017 12:00 AM   ?? Albumin 4.4 12/05/2017 12:00 AM   ?? Globulin 3.1 08/09/2017 03:45 AM   ?? A-G Ratio 1.0 (L) 08/09/2017 03:45 AM   ?? ALT (SGPT) 66 (H) 12/05/2017 12:00 AM      ??  ??   ASSESSMENT      1.??Cardiomyopathy  ????????????????????????A. NICM  ????????????????????????B. LVEF 20-25%  ????????????????????????C. NYHA II  ????????????????????????D. QRS duration 96 msec  2.??Atrial flutter  ????????????????????????A. DCCV  ????????????????????????B. Amiodarone  3.??Mitral regurgitation  ????????????????????????A. Moderate  ??  ??  ??   PLAN   ??  Atrial flutter ablation  ??  Candis Schatz, MD  Cardiac Electrophysiology / Cardiology  ??  [facility]  [facility]  [address], [address], [address], [place]                                         [place], [place]  [phone] / [phone] Fax                                    [phone] / [phone] Fax  ??  ??

## 2018-05-09 NOTE — Progress Notes (Signed)
Pt sitting up tolerating food tray well.

## 2018-05-09 NOTE — Anesthesia Post-Procedure Evaluation (Signed)
Procedure(s):  ABLATION A-FLUTTER  Ep 3d Mapping.    MAC    Anesthesia Post Evaluation      Multimodal analgesia: multimodal analgesia used between 6 hours prior to anesthesia start to PACU discharge  Patient location during evaluation: bedside  Patient participation: waiting for patient participation  Level of consciousness: awake  Pain management: adequate  Airway patency: patent  Anesthetic complications: no  Cardiovascular status: acceptable  Respiratory status: unassisted  Hydration status: acceptable  Comments: Post-Anesthesia Evaluation and Assessment    I have evaluated the patient and they are ready for PACU discharge.    Patient: Steve Green MRN: [id number]  SSN: [id number]   Date of Birth: 10/19/1960  Age: 57 y.o.  Sex: male      Cardiovascular Function/Vital Signs  BP (!) 177/103    Pulse 68    Temp 36.7 ??C (98 ??F)    Resp 16    Ht 5' 9" (1.753 m)    Wt 68.5 kg (151 lb)    SpO2 99%    BMI 22.30 kg/m??     Patient is status post MAC anesthesia for Procedure(s):  ABLATION A-FLUTTER  Ep 3d Mapping.    Nausea/Vomiting: None    Postoperative hydration reviewed and adequate.    Pain:  Pain Scale 1: Numeric (0 - 10) (05/09/18 1204)  Pain Intensity 1: 0 (05/09/18 1204)   Managed    Neurological Status:       At baseline    Mental Status, Level of Consciousness: Alert and  oriented to person, place, and time    Pulmonary Status:   O2 Device: Room air (05/09/18 1314)   Adequate oxygenation and airway patent    Complications related to anesthesia: None    Post-anesthesia assessment completed. No concerns    Signed By: Zionna Homewood J Shareena Nusz, MD    May 09, 2018                   Vitals Value Taken Time   BP 182/120 05/09/2018  1:46 PM   Temp     Pulse 84 05/09/2018  1:48 PM   Resp     SpO2 97 % 05/09/2018  1:48 PM   Vitals shown include unvalidated device data.

## 2018-05-09 NOTE — Progress Notes (Signed)
TRANSFER - IN REPORT:    Verbal report received from Joan Brown CRNA on Steve Green  being received from procedure area for routine progression of care. Report consisted of patient???s Situation, Background, Assessment and Recommendations(SBAR). Information from the following report(s) SBAR, Procedure Summary, MAR, Recent Results and Cardiac Rhythm NSR was reviewed with the receiving clinician. Opportunity for questions and clarification was provided. Assessment completed upon patient???s arrival to Cardiac Cath Lab RECOVERY AREA and care assumed.       Cardiac Cath Lab Recovery Arrival Note:    Steve Green arrived to CCL recovery area.  Patient procedure= Aflutter Ablation. Patient on cardiac monitor, non-invasive blood pressure, SPO2 monitor. On Room Air .  IV  of NS on pump at 25 ml/hr. Patient status doing well without problems. Patient is A&Ox 4. Patient reports Chest Pain= burning  7/10 Dr Shams made aware.    PROCEDURE SITE CHECK:    Procedure site:without any bleeding and No Hematoma, No pain/discomfort reported at procedure site.     No change in patient status. Continue to monitor patient and status.

## 2018-05-09 NOTE — Progress Notes (Signed)
MCT loop mailed per Dr Shams dx: aflutter  Chargeable visit.

## 2018-05-09 NOTE — Progress Notes (Signed)
Pt verbalized understanding of discharge instructions. PIV removed and guaze with tape placed;no bleeding or swelling.Pt escorted to car via wheelchair with belongings.

## 2018-05-09 NOTE — Progress Notes (Signed)
Cardiac Cath Lab Recovery Arrival Note:      Steve Green arrived to Cardiac Cath Lab, Recovery Area. Staff introduced to patient. Patient identifiers verified with NAME and DATE OF BIRTH. Procedure verified with patient. Consent forms reviewed and signed by patient or authorized representative and verified. Allergies verified.     Patient and family oriented to department. Patient and family informed of procedure and plan of care.     Questions answered with review. Patient prepped for procedure, per orders from physician, prior to arrival.    Patient on cardiac monitor, non-invasive blood pressure, SPO2 monitor. On Room Air. Patient is A&Ox 4. Patient reports No Pain.     Patient in stretcher, in low position, with side rails up, call bell within reach, patient instructed to call if assistance as needed.    Patient prep in: CCL Recovery Area, [place]  Family in: Waiting Room.   Prep by: Katherine Bila-Webster RN

## 2018-05-09 NOTE — Procedures (Addendum)
Cardiac Electrophysiology Report      PATIENT INFORMATION     Patient Name: Steve Green  MRN: [id number]              Study Date: 05/09/2018    Date of Birth: April 30, 1960   Age: 58 y.o.                       Gender: male      Procedure:  Atrial Flutter AblationRa    Referring Physician:  Stephenie Acres, MD and Dr. Lorella Nimrod       STAFF     Duty Name   Electrophysiologist Charyl Dancer, MD   Monitor Anesthesia service   Circulator Othelia Pulling, RN; Antonietta Barcelona, RN; Berneda Rose, RN; Norwood Levo Manganello, RCES       PATIENT HISTORY     Steve Green??is a 58 y.o.??male??with nonischemic cardiomyopathy, LVEF 15-20% (echocardiogram 08/07/17), moderate MR, atrial flutter s/p DCCV (on amiodarone) referred to discuss atrial flutter and ICD therapy for primary prevention of sudden death. He is currently on guideline directed medical therapy. He is on eliquis for CVA risk reduction.?? LV function has improved to 39%. He now presents for possible atrial flutter ablation.       PROCEDURE     The patient was brought to the Cardiac Electrophysiology laboratory in a post-absorptive, fasting state.  Informed consent was obtained. A peripheral IV was in place.  Continuous electrocardiographic, blood pressure, O2 saturation and expired CO2 monitoring was initiated.  Self-adhesive cardioversion patches were positioned on the chest.  Conscious sedation was administered per protocol. The patient was then prepped and draped in the usual sterile fashion.  Both groins were infiltrated with a 50/50 mixture of Lidocaine (1%) and bupivicaine (0.5%).   Vascular access was obtained in the right common femoral vein using a Ramp sheath and a 47F sheath. Through these sheaths, a deflectable, decapolar catheter was advanced and positioned into the coronary sinus for left atrial pacing/recording. A comprehensive electrophysiology study was performed with catheter positioning in the RA, His, RV and coronary sinus  for recording/pacing. Induction of the tachycardia was attempted with atrial burst pacing however the tachycardia could not be induced. Several applications of contiguous radiofrequency ablation were delivered across the CTI from the tricuspid annulus to the level of the inferior vena cava until bi-directional conduction block was demonstrated using differential pacing. This bi-directional block persisted following an observation period. A comprehensive electrophysiology study was performed. At the conclusion of the procedure,  all catheters and sheaths were removed  and hemostasis obtained by utilizing suture mediated closure devices.  The patient remained hemodynamically stable, tolerated the procedure well and was transferred in stable condition. There were no immediate complications encountered during the procedure.       CONDUCTION INTERVALS     See attached report      FINDINGS     1. The baseline ECG revealed sinus rhythm without ventricular preexcitation.   2. Tachycardia could not be induced despite atrial burst pacing.   3. Several contiguous applications of radiofrequency ablation were delivered across the cavo-tricuspid isthmus until bi-directional conduction block was demonstrated and persisted following an observation period. This was confirmed using differential pacing.         RADIOLOGY SUMMARY      Total   Fluoro Time (minutes) 2.4   Dose Area Product (mGy) 52       CONCLUSIONS        1. Successful cavotricuspid  isthmus ablation  2. Discontinue anticoagulation/amiodarone and plan for 30 day monitor  3. Follow up in 2 weeks in EP clinic.   4. Follow up with  Stephenie Acres, MD and Dr. Lorella Nimrod as scheduled.       Thank you, Stephenie Acres, MD and Dr. Lorella Nimrod for allowing me to participate in the care of this extraordinarily pleasant male.         Charyl Dancer, MD  Cardiac Electrophysiology / Cardiology    [facility]  [facility]    [facility]   [address], [address]               [address]  [place], [place]  [address]                [place], [place]  [address]  [phone] / [phone] Fax   [phone] / [phone] Fax

## 2018-05-09 NOTE — Progress Notes (Signed)
 Pt verbalized understanding of discharge instructions. PIV removed and guaze with tape placed; no bleeding or swelling . Pt escorted to car via wheelchair with belongings.

## 2018-05-09 NOTE — Progress Notes (Signed)
 TRANSFER - IN REPORT:    Verbal report received from Candis Daring CRNA on Steve Green  being received from procedure area for routine progression of care. Report consisted of patient's Situation, Background, Assessment and Recommendations(SBAR). Information from the following report(s) SBAR, Procedure Summary, MAR, Recent Results and Cardiac Rhythm NSR was reviewed with the receiving clinician. Opportunity for questions and clarification was provided. Assessment completed upon patient's arrival to Cardiac Cath Lab RECOVERY AREA and care assumed.       Cardiac Cath Lab Recovery Arrival Note:    Steve Green arrived to CCL recovery area.  Patient procedure= Aflutter Ablation. Patient on cardiac monitor, non-invasive blood pressure, SPO2 monitor. On Room Air .  IV  of NS on pump at 25 ml/hr. Patient status doing well without problems. Patient is A&Ox 4. Patient reports Chest Pain= burning  7/10 Dr Phillips made aware.    PROCEDURE SITE CHECK:    Procedure site:without any bleeding and No Hematoma, No pain/discomfort reported at procedure site.     No change in patient status. Continue to monitor patient and status.

## 2018-05-09 NOTE — Progress Notes (Signed)
Pt medicated with Dilaudid 0.5 mg IVP for Chest burning pain 7/10 as per Dr Irving Burton. Will monitor for relief. Dr Irving Burton aware of elevated BP=178/112.

## 2018-05-09 NOTE — H&P (Signed)
H&P by Gailen Shelter, MD  at 05/09/18 1107                Author: Gailen Shelter, MD  Service: Cardiology  Author Type: Physician       Filed: 05/09/18 1107  Date of Service: 05/09/18 1107  Status: Signed          Editor: Gailen Shelter, MD (Physician)                    ??   ??   ??     HISTORY OF PRESENTING ILLNESS         Steve Green is a 58 y.o. male with nonischemic cardiomyopathy, LVEF 15-20% (echocardiogram 08/07/17), moderate MR, atrial flutter s/p DCCV (on amiodarone) referred to discuss atrial flutter and ICD therapy for primary prevention of sudden death.  He is currently on guideline directed medical therapy. He is on eliquis for CVA risk reduction.??   LV function has improved to 39%.   ??   ??      ACTIVE PROBLEM LIST     ??                  Patient Active Problem List         ??  Diagnosis  Date Noted         ?  Systolic CHF, chronic ([place])  08/07/2017     ?  Atrial fibrillation with RVR ([place])  08/06/2017     ?  Chest pain  08/06/2017     ?  Hyperglycemia  08/06/2017     ?  Elevated troponin  08/06/2017         ??   ??      PAST MEDICAL HISTORY     ??                  Past Medical History:        Diagnosis  Date         ?  A-fib ([place])  ??     ?  Hypertension  ??         ??   ??      PAST SURGICAL HISTORY     ??   History reviewed. No pertinent surgical history.   ??   ??      ALLERGIES     ??   No Known Allergies    ??   ??     FAMILY HISTORY     ??                     Family History         Problem  Relation  Age of Onset          ?  Heart Disease  Other  ??      negative for cardiac disease   ??   ??      SOCIAL HISTORY     ??   Social History             ??               Socioeconomic History      ?  Marital status:  SINGLE      ??  ??  Spouse name:  Not on file      ?  Number of children:  Not on file      ?  Years of  education:  Not on file      ?  Highest education level:  Not on file      Tobacco Use      ?  Smoking status:  Current Some Day Smoker      ??  ??  Packs/day:  0.50      ??  ??  Years:  30.00      ??  ??  Pack  years:  15.00      ??  ??  Last attempt to quit:  07/13/2017      ??  ??  Years since quitting:  0.6      ?  Smokeless tobacco:  Never Used      Substance and Sexual Activity      ?  Alcohol use:  Yes      ??  ??  Alcohol/week:  24.0 standard drinks      ??  ??  Types:  24 Cans of beer per week      ?  Drug use:  Never      ?  Sexual activity:  Not Currently      ??  ??  Partners:  Female           ??   ??   ??     MEDICATIONS     ??                  Current Outpatient Medications        Medication  Sig         ?  apixaban (ELIQUIS) 5 mg tablet  Take 1 Tab by mouth two (2) times a day.     ?  metoprolol succinate (TOPROL-XL) 25 mg XL tablet  Take 0.5 Tabs by mouth daily.     ?  lisinopril (PRINIVIL, ZESTRIL) 2.5 mg tablet  Take 1 Tab by mouth daily.     ?  amiodarone (CORDARONE) 200 mg tablet  Take 0.5 Tabs by mouth daily.     ??     No current facility-administered medications for this visit.      ??   ??   I have reviewed the nurses notes, vitals, problem list, allergy list, medical history, family, social history and medications.   ??   ??      REVIEW OF SYMPTOMS         General: Pt denies excessive weight gain or loss. Pt is able to conduct ADL's   HEENT: Denies blurred vision, headaches, hearing loss, epistaxis and difficulty swallowing.   Respiratory: Denies cough, congestion, shortness of breath, DOE, wheezing or stridor.   Cardiovascular: Denies precordial pain, palpitations, edema or PND   Gastrointestinal: Denies poor appetite, indigestion, abdominal pain or blood in stool   Genitourinary: Denies hematuria, dysuria, increased urinary frequency   Musculoskeletal: Denies joint pain or swelling from muscles or joints   Neurologic: Denies tremor, paresthesias, headache, or sensory motor disturbance   Psychiatric: Denies confusion, insomnia, depression   Integumentray: Denies rash, itching or ulcers.   Hematologic: Denies easy bruising, bleeding   ??   ??      PHYSICAL EXAMINATION                     Vitals:        ??  03/04/18 1035         BP:  132/70     Pulse:  68     Resp:  18  SpO2:  94%     Weight:  156 lb (70.8 kg)     Height:  '5\' 8"'  (1.727 m)     ??   General: Well developed, in no acute distress.   HEENT: No jaundice, oral mucosa moist, no oral ulcers   Neck: Supple, no stiffness, no lymphadenopathy, supple   Heart: ??Normal S1/S2 negative S3 or S4. Regular, no murmur, gallop or rub, no jugular venous distention   Respiratory: Clear bilaterally x 4, no wheezing or rales   Abdomen:?? ??Soft, non-tender, bowel sounds are active.??   Extremities:  No edema, normal cap refill, no cyanosis.   Musculoskeletal: No clubbing, no deformities   Neuro: A&Ox3, speech clear, gait stable, cooperative, no focal neurologic deficits   Skin: Skin color is normal. No rashes or lesions. Non diaphoretic, moist.   Vascular: 2+ pulses symmetric in all extremities   ??   ??      DIAGNOSTIC DATA         EKG:    ??   ??      LABORATORY DATA                           Lab Results         Component  Value  Date/Time          ??  WBC  5.5  12/05/2017 12:00 AM     ??  HGB  14.6  12/05/2017 12:00 AM     ??  HCT  41.2  12/05/2017 12:00 AM     ??  PLATELET  204  12/05/2017 12:00 AM     ??  MCV  95  12/05/2017 12:00 AM                           Lab Results         Component  Value  Date/Time          ??  Sodium  139  12/05/2017 12:00 AM     ??  Potassium  4.3  12/05/2017 12:00 AM     ??  Chloride  103  12/05/2017 12:00 AM     ??  CO2  19 (L)  12/05/2017 12:00 AM     ??  Anion gap  5  08/10/2017 04:09 AM     ??  Glucose  88  12/05/2017 12:00 AM     ??  BUN  13  12/05/2017 12:00 AM     ??  Creatinine  0.88  12/05/2017 12:00 AM     ??  BUN/Creatinine ratio  15  12/05/2017 12:00 AM     ??  GFR est AA  109  12/05/2017 12:00 AM     ??  GFR est non-AA  95  12/05/2017 12:00 AM     ??  Calcium  9.5  12/05/2017 12:00 AM     ??  Bilirubin, total  0.5  12/05/2017 12:00 AM     ??  AST (SGOT)  55 (H)  12/05/2017 12:00 AM     ??  Alk. phosphatase  138 (H)  12/05/2017 12:00 AM     ??  Protein, total  7.2   12/05/2017 12:00 AM     ??  Albumin  4.4  12/05/2017 12:00 AM     ??  Globulin  3.1  08/09/2017 03:45 AM     ??  A-G Ratio  1.0 (L)  08/09/2017 03:45 AM     ??  ALT (SGPT)  66 (H)  12/05/2017 12:00 AM         ??   ??      ASSESSMENT         1.??Cardiomyopathy   ????????????????????????A. NICM   ????????????????????????B. LVEF 20-25%   ????????????????????????C. NYHA II   ????????????????????????D. QRS duration 96 msec   2.??Atrial flutter   ????????????????????????A. DCCV   ????????????????????????B. Amiodarone   3.??Mitral regurgitation   ????????????????????????A. Moderate   ??   ??   ??      PLAN     ??   Atrial flutter ablation   ??   Candis Schatz, MD   Cardiac Electrophysiology / Cardiology   ??   [facility]   [address], [address], [address], [place]                                         [place], [place]   [phone] / [phone] Fax                                    [phone] / [phone] Fax   ??   ??

## 2018-05-09 NOTE — Procedures (Signed)
Procedures  by Adair Laundry, MD at 05/09/18 1301                Author: Adair Laundry, MD  Service: Cardiology  Author Type: Physician       Filed: 05/09/18 1451  Date of Service: 05/09/18 1301  Status: Addendum          Editor: Adair Laundry, MD (Physician)          Related Notes: Original Note by Adair Laundry, MD (Physician) filed at 05/09/18 1306                                       Cardiac Electrophysiology Report           PATIENT INFORMATION        Patient Name: Steve Green  MRN: [id number]               Study Date: 05/09/2018      Date of Birth: 1960-06-08   Age: 58 y.o.                        Gender: male        Procedure:  Atrial Flutter AblationRa      Referring Physician:  Stephenie Acres, MD and Dr. Lorella Nimrod            STAFF           Duty  Name     Electrophysiologist  Charyl Dancer, MD     Monitor  Anesthesia service        Circulator  Othelia Pulling, RN; Antonietta Barcelona, RN; Berneda Rose, RN; Norwood Levo Manganello, RCES             PATIENT HISTORY        Steve Green??is a 58 y.o.??male??with nonischemic cardiomyopathy, LVEF 15-20% (echocardiogram 08/07/17), moderate MR, atrial flutter s/p DCCV (on amiodarone) referred to discuss atrial flutter and ICD therapy  for primary prevention of sudden death. He is currently on guideline directed medical therapy. He is on eliquis for CVA risk reduction.?? LV function has improved to 39%. He now presents for possible atrial flutter ablation.            PROCEDURE        The patient was brought to the Cardiac Electrophysiology laboratory in a post-absorptive, fasting state.  Informed consent was obtained. A peripheral IV was in place.  Continuous electrocardiographic, blood pressure, O 2 saturation and expired CO2 monitoring was initiated.  Self-adhesive cardioversion patches were positioned on the chest.  Conscious sedation was administered per  protocol. The patient was then prepped and draped in the usual sterile fashion.  Both groins were infiltrated with a  50/50 mixture of Lidocaine (1%) and bupivicaine (0.5%).   Vascular access was obtained in the right common femoral vein using a Ramp sheath  and a 46F sheath. Through these sheaths, a deflectable, decapolar catheter was advanced and positioned into the coronary sinus for left atrial pacing/recording. A comprehensive electrophysiology study was performed with catheter positioning in the RA,  His, RV and coronary sinus for recording/pacing. Induction of the tachycardia was attempted with atrial burst pacing however the tachycardia could not be induced. Several applications of contiguous radiofrequency ablation were delivered across the CTI  from the tricuspid annulus to the level of the inferior vena cava until bi-directional conduction  block was demonstrated using differential pacing. This bi-directional block persisted following an observation period. A comprehensive electrophysiology  study was performed. At the conclusion of the procedure,  all catheters and sheaths were removed  and hemostasis obtained by utilizing suture mediated closure devices.  The patient remained hemodynamically stable, tolerated the procedure well and was  transferred in stable condition. There were no immediate complications encountered during the procedure.            CONDUCTION INTERVALS        See attached report           FINDINGS        1. The baseline ECG revealed sinus rhythm without ventricular preexcitation.    2. Tachycardia could not be induced despite atrial burst pacing.    3. Several contiguous applications of radiofrequency ablation were delivered across the cavo-tricuspid isthmus until bi-directional conduction block was demonstrated and persisted following an observation period. This was confirmed using differential  pacing.              RADIOLOGY SUMMARY             Total        Fluoro Time (minutes)  2.4        Dose Area Product (mGy)  52           CONCLUSIONS                1. Successful cavotricuspid isthmus ablation    2. Discontinue anticoagulation/amiodarone and plan for 30 day monitor   3. Follow up in 2 weeks in EP clinic.    4. Follow up with  Stephenie Acres, MD and Dr. Lorella Nimrod as scheduled.          Thank you, Stephenie Acres, MD and Dr. Lorella Nimrod for allowing me to participate in the care of this extraordinarily pleasant male.             Charyl Dancer, MD   Cardiac Electrophysiology / Cardiology      [facility]   [facility]    [facility]   [address], [address]               [address]   [place], [place]  [address]                [place], [place]  [address]   [phone] / [phone] Fax   [phone] / [phone] Fax

## 2018-05-09 NOTE — Progress Notes (Signed)
Cardiac Cath Lab Recovery Arrival Note:      Steve Green arrived to Cardiac Cath Lab, Recovery Area. Staff introduced to patient. Patient identifiers verified with NAME and DATE OF BIRTH. Procedure verified with patient. Consent forms reviewed and signed by patient or authorized representative and verified. Allergies verified.     Patient and family oriented to department. Patient and family informed of procedure and plan of care.     Questions answered with review. Patient prepped for procedure, per orders from physician, prior to arrival.    Patient on cardiac monitor, non-invasive blood pressure, SPO2 monitor. On Room Air. Patient is A&Ox 4. Patient reports No Pain.     Patient in stretcher, in low position, with side rails up, call bell within reach, patient instructed to call if assistance as needed.    Patient prep in: CCL Recovery Area, [place]  Family in: Waiting Room.   Prep by: Joellen Jersey RN

## 2018-05-09 NOTE — Anesthesia Pre-Procedure Evaluation (Signed)
Relevant Problems   No relevant active problems       Anesthetic History   No history of anesthetic complications            Review of Systems / Medical History  Patient summary reviewed, nursing notes reviewed and pertinent labs reviewed    Pulmonary  Within defined limits                 Neuro/Psych   Within defined limits           Cardiovascular    Hypertension        Dysrhythmias            GI/Hepatic/Renal  Within defined limits              Endo/Other  Within defined limits           Other Findings              Physical Exam    Airway  Mallampati: II  TM Distance: > 6 cm  Neck ROM: normal range of motion   Mouth opening: Normal     Cardiovascular  Regular rate and rhythm,  S1 and S2 normal,  no murmur, click, rub, or gallop             Dental  No notable dental hx       Pulmonary  Breath sounds clear to auscultation               Abdominal  GI exam deferred       Other Findings            Anesthetic Plan    ASA: 2  Anesthesia type: MAC            Anesthetic plan and risks discussed with: Patient

## 2018-05-09 NOTE — Anesthesia Post-Procedure Evaluation (Signed)
Procedure(s):  ABLATION A-FLUTTER  Ep 3d Mapping.    MAC    Anesthesia Post Evaluation      Multimodal analgesia: multimodal analgesia used between 6 hours prior to anesthesia start to PACU discharge  Patient location during evaluation: bedside  Patient participation: waiting for patient participation  Level of consciousness: awake  Pain management: adequate  Airway patency: patent  Anesthetic complications: no  Cardiovascular status: acceptable  Respiratory status: unassisted  Hydration status: acceptable  Comments: Post-Anesthesia Evaluation and Assessment    I have evaluated the patient and they are ready for PACU discharge.    Patient: Steve Green MRN: [id number]  SSN: [id number]   Date of Birth: 25-Jan-1961  Age: 58 y.o.  Sex: male      Cardiovascular Function/Vital Signs  BP (!) 177/103    Pulse 68    Temp 36.7 ??C (98 ??F)    Resp 16    Ht _0  (1.753 m)    Wt 68.5 kg (151 lb)    SpO2 99%    BMI 22.30 kg/m??     Patient is status post MAC anesthesia for Procedure(s):  ABLATION A-FLUTTER  Ep 3d Mapping.    Nausea/Vomiting: None    Postoperative hydration reviewed and adequate.    Pain:  Pain Scale 1: Numeric (0 - 10) (05/09/18 1204)  Pain Intensity 1: 0 (05/09/18 1204)   Managed    Neurological Status:       At baseline    Mental Status, Level of Consciousness: Alert and  oriented to person, place, and time    Pulmonary Status:   O2 Device: Room air (05/09/18 1314)   Adequate oxygenation and airway patent    Complications related to anesthesia: None    Post-anesthesia assessment completed. No concerns    Signed By: Brandt Loosen, MD    May 09, 2018                   Vitals Value Taken Time   BP 182/120 05/09/2018  1:46 PM   Temp     Pulse 84 05/09/2018  1:48 PM   Resp     SpO2 97 % 05/09/2018  1:48 PM   Vitals shown include unvalidated device data.

## 2018-05-09 NOTE — Progress Notes (Signed)
MCT loop mailed per Dr Irving Burton dx: aflutter  Chargeable visit.

## 2018-05-23 ENCOUNTER — Ambulatory Visit
Admit: 2018-05-23 | Discharge: 2018-05-23 | Payer: PRIVATE HEALTH INSURANCE | Attending: Nurse Practitioner | Primary: Student in an Organized Health Care Education/Training Program

## 2018-05-23 ENCOUNTER — Ambulatory Visit: Attending: Nurse Practitioner | Primary: Student in an Organized Health Care Education/Training Program

## 2018-05-23 DIAGNOSIS — I4891 Unspecified atrial fibrillation: Secondary | ICD-10-CM

## 2018-05-23 NOTE — Progress Notes (Signed)
HISTORY OF PRESENTING ILLNESS      Steve Green is a 58 y.o. male  with nonischemic cardiomyopathy, LVEF 15-20% (echocardiogram 08/07/17), moderate MR, atrial flutter s/p DCCV (on amiodarone) referred to discuss atrial flutter and ICD therapy. He underwent successful cavotricuspid isthmus ablation. Amiodarone and anticoagulation was discontinued and 30 day monitor ordered. It shows sinus rhythm thus far and he denies cardiac complaints.       ACTIVE PROBLEM LIST     Patient Active Problem List    Diagnosis Date Noted   ??? Status post ablation of ventricular arrhythmia 05/09/2018     Priority: 1 - One   ??? Systolic CHF, chronic ([place]) 08/07/2017   ??? Atrial fibrillation with RVR ([place]) 08/06/2017   ??? Chest pain 08/06/2017   ??? Hyperglycemia 08/06/2017   ??? Elevated troponin 08/06/2017           PAST MEDICAL HISTORY     Past Medical History:   Diagnosis Date   ??? A-fib ([place])    ??? Hypertension            PAST SURGICAL HISTORY     Past Surgical History:   Procedure Laterality Date   ??? PR EPHYS EVAL W/ABLATION SUPRAVENT ARRHYTHMIA N/A 05/09/2018    ABLATION A-FLUTTER performed by Gailen Shelter, MD at [facility]   ??? PR INTRACARDIAC ELECTROPHYSIOLOGIC 3D MAPPING N/A 05/09/2018    Ep 3d Mapping performed by Gailen Shelter, MD at [facility]ATH [facility]          ALLERGIES     No Known Allergies       FAMILY HISTORY     Family History   Problem Relation Age of Onset   ??? Heart Disease Other     negative for cardiac disease       SOCIAL HISTORY     Social History     Socioeconomic History   ??? Marital status: SINGLE     Spouse name: Not on file   ??? Number of children: Not on file   ??? Years of education: Not on file   ??? Highest education level: Not on file   Tobacco Use   ??? Smoking status: Current Some Day Smoker     Packs/day: 0.50     Years: 30.00     Pack years: 15.00     Last attempt to quit: 07/13/2017     Years since quitting: 0.8   ??? Smokeless tobacco: Never Used   Substance and Sexual Activity   ??? Alcohol use: Yes      Alcohol/week: 24.0 standard drinks     Types: 24 Cans of beer per week   ??? Drug use: Never   ??? Sexual activity: Not Currently     Partners: Female         MEDICATIONS     Current Outpatient Medications   Medication Sig   ??? metoprolol succinate (TOPROL-XL) 25 mg XL tablet TAKE 1/2 TABLET BY MOUTH EVERY DAY   ??? lisinopril (PRINIVIL, ZESTRIL) 2.5 mg tablet TAKE 1 TABLET BY MOUTH DAILY     No current facility-administered medications for this visit.        I have reviewed the nurses notes, vitals, problem list, allergy list, medical history, family, social history and medications.       REVIEW OF SYMPTOMS      General: Pt denies excessive weight gain or loss. Pt is able to conduct ADL's  HEENT: Denies blurred vision, headaches,  hearing loss, epistaxis and difficulty swallowing.  Respiratory: Denies cough, congestion, shortness of breath, DOE, wheezing or stridor.  Cardiovascular: Denies precordial pain, palpitations, edema or PND  Gastrointestinal: Denies poor appetite, indigestion, abdominal pain or blood in stool  Genitourinary: Denies hematuria, dysuria, increased urinary frequency  Musculoskeletal: Denies joint pain or swelling from muscles or joints  Neurologic: Denies tremor, paresthesias, headache, or sensory motor disturbance  Psychiatric: Denies confusion, insomnia, depression  Integumentray: Denies rash, itching or ulcers.  Hematologic: Denies easy bruising, bleeding       PHYSICAL EXAMINATION      There were no vitals filed for this visit.  General: Well developed, in no acute distress.  HEENT: No jaundice, oral mucosa moist, no oral ulcers  Neck: Supple, no stiffness, no lymphadenopathy, supple  Heart: ??Normal S1/S2 negative S3 or S4. Regular, no murmur, gallop or rub, no jugular venous distention  Respiratory: Clear bilaterally x 4, no wheezing or rales  Abdomen:?? ??Soft, non-tender, bowel sounds are active.??  Extremities:  No edema, normal cap refill, no cyanosis.   Musculoskeletal: No clubbing, no deformities  Neuro: A&Ox3, speech clear, gait stable, cooperative, no focal neurologic deficits  Skin: Skin color is normal. No rashes or lesions. Non diaphoretic, moist.  Vascular: 2+ pulses symmetric in all extremities       DIAGNOSTIC DATA      EKG:        LABORATORY DATA      Lab Results   Component Value Date/Time    WBC 5.8 05/09/2018 07:53 AM    HGB 13.3 05/09/2018 07:53 AM    HCT 37.9 05/09/2018 07:53 AM    PLATELET 194 05/09/2018 07:53 AM    MCV 94.5 05/09/2018 07:53 AM      Lab Results   Component Value Date/Time    Sodium 142 05/09/2018 07:52 AM    Potassium 3.9 05/09/2018 07:52 AM    Chloride 111 (H) 05/09/2018 07:52 AM    CO2 26 05/09/2018 07:52 AM    Anion gap 5 05/09/2018 07:52 AM    Glucose 96 05/09/2018 07:52 AM    BUN 13 05/09/2018 07:52 AM    Creatinine 0.91 05/09/2018 07:52 AM    BUN/Creatinine ratio 14 05/09/2018 07:52 AM    GFR est AA >60 05/09/2018 07:52 AM    GFR est non-AA >60 05/09/2018 07:52 AM    Calcium 8.8 05/09/2018 07:52 AM    Bilirubin, total 0.5 12/05/2017 12:00 AM    AST (SGOT) 55 (H) 12/05/2017 12:00 AM    Alk. phosphatase 138 (H) 12/05/2017 12:00 AM    Protein, total 7.2 12/05/2017 12:00 AM    Albumin 4.4 12/05/2017 12:00 AM    Globulin 3.1 08/09/2017 03:45 AM    A-G Ratio 1.0 (L) 08/09/2017 03:45 AM    ALT (SGPT) 66 (H) 12/05/2017 12:00 AM           ASSESSMENT      1.??Cardiomyopathy  ????????????????????????A. NICM  ????????????????????????B. LVEF 20-25%  ????????????????????????C. NYHA II  ????????????????????????D. QRS duration 96 msec  2.??Atrial flutter  ????????????????????????A. DCCV  ????????????????????????B. Amiodarone  3.??Mitral regurgitation  ????????????????????????A. Moderate  ??     PLAN     Continue 30 day monitor to evaluate for additional arrhythmia for which Eliquis would need to be restarted. Continue metoprolol.     FOLLOW-UP   3 months    Thank you, Forrest Moron, MD and Dr. Mort Sawyers for allowing me to participate in the care of this extraordinarily pleasant male. Please do  not  hesitate to contact me for further questions/concerns.       Grier Mitts, NP

## 2018-05-23 NOTE — Progress Notes (Signed)
Chief Complaint   Patient presents with   ??? Irregular Heart Beat     Visit Vitals  BP 118/74 (BP 1 Location: Left arm, BP Patient Position: Sitting)   Pulse 74   Ht 5' 9" (1.753 m)   Wt 154 lb (69.9 kg)   SpO2 97%   BMI 22.74 kg/m??     Patient in offices  no cardiac complaints.    No hospital stays.    No cardiac refills

## 2018-05-23 NOTE — Progress Notes (Signed)
Progress  Notes by Grier Mitts, NP at 05/23/18 0920                Author: Grier Mitts, NP  Service: --  Author Type: Nurse Practitioner       Filed: 05/23/18 0939  Encounter Date: 05/23/2018  Status: Signed          Editor: Grier Mitts, NP (Nurse Practitioner)                                  HISTORY OF PRESENTING ILLNESS         Steve Green is a 58 y.o.  male  with nonischemic cardiomyopathy, LVEF 15-20% (echocardiogram 08/07/17), moderate MR, atrial flutter s/p DCCV (on amiodarone) referred to  discuss atrial flutter and ICD therapy. He underwent successful cavotricuspid isthmus ablation. Amiodarone and anticoagulation was discontinued and 30 day monitor ordered. It shows sinus rhythm thus far and he denies cardiac complaints.            ACTIVE PROBLEM LIST          Patient Active Problem List           Diagnosis  Date Noted         ?  Status post ablation of ventricular arrhythmia  05/09/2018             Priority: 1 - One         ?  Systolic CHF, chronic ([place])  08/07/2017     ?  Atrial fibrillation with RVR ([place])  08/06/2017     ?  Chest pain  08/06/2017     ?  Hyperglycemia  08/06/2017         ?  Elevated troponin  08/06/2017                  PAST MEDICAL HISTORY          Past Medical History:        Diagnosis  Date         ?  A-fib ([place])           ?  Hypertension                    PAST SURGICAL HISTORY          Past Surgical History:         Procedure  Laterality  Date          ?  PR EPHYS EVAL W/ABLATION SUPRAVENT ARRHYTHMIA  N/A  05/09/2018          ABLATION A-FLUTTER performed by Gailen Shelter, MD at [facility]          ?  PR INTRACARDIAC ELECTROPHYSIOLOGIC 3D MAPPING  N/A  05/09/2018          Ep 3d Mapping performed by Gailen Shelter, MD at [facility] CATH [facility]                 ALLERGIES        No Known Allergies            FAMILY HISTORY          Family History         Problem  Relation  Age of Onset          ?  Heart Disease  Other  negative for cardiac disease             SOCIAL HISTORY          Social History          Socioeconomic History         ?  Marital status:  SINGLE              Spouse name:  Not on file         ?  Number of children:  Not on file     ?  Years of education:  Not on file     ?  Highest education level:  Not on file       Tobacco Use         ?  Smoking status:  Current Some Day Smoker              Packs/day:  0.50         Years:  30.00         Pack years:  15.00         Last attempt to quit:  07/13/2017         Years since quitting:  0.8         ?  Smokeless tobacco:  Never Used       Substance and Sexual Activity         ?  Alcohol use:  Yes              Alcohol/week:  24.0 standard drinks         Types:  24 Cans of beer per week         ?  Drug use:  Never     ?  Sexual activity:  Not Currently              Partners:  Female                MEDICATIONS          Current Outpatient Medications        Medication  Sig         ?  metoprolol succinate (TOPROL-XL) 25 mg XL tablet  TAKE 1/2 TABLET BY MOUTH EVERY DAY         ?  lisinopril (PRINIVIL, ZESTRIL) 2.5 mg tablet  TAKE 1 TABLET BY MOUTH DAILY          No current facility-administered medications for this visit.            I have reviewed the nurses notes, vitals, problem list, allergy list, medical history, family, social history and medications.            REVIEW OF SYMPTOMS         General: Pt denies excessive weight gain or loss. Pt is able to conduct ADL's   HEENT: Denies blurred vision, headaches, hearing loss, epistaxis and difficulty swallowing.   Respiratory: Denies cough, congestion, shortness of breath, DOE, wheezing or stridor.   Cardiovascular: Denies precordial pain, palpitations, edema or PND   Gastrointestinal: Denies poor appetite, indigestion, abdominal pain or blood in stool   Genitourinary: Denies hematuria, dysuria, increased urinary frequency   Musculoskeletal: Denies joint pain or swelling from muscles or joints   Neurologic: Denies tremor, paresthesias, headache, or sensory motor  disturbance   Psychiatric: Denies confusion, insomnia, depression   Integumentray: Denies rash, itching or ulcers.   Hematologic: Denies easy bruising, bleeding  PHYSICAL EXAMINATION         There were no vitals filed for this visit.   General: Well developed, in no acute distress.   HEENT: No jaundice, oral mucosa moist, no oral ulcers   Neck: Supple, no stiffness, no lymphadenopathy, supple   Heart: ??Normal S1/S2 negative S3 or S4. Regular, no murmur, gallop or rub, no jugular venous distention   Respiratory: Clear bilaterally x 4, no wheezing or rales   Abdomen:?? ??Soft, non-tender, bowel sounds are active.??   Extremities:  No edema, normal cap refill, no cyanosis.   Musculoskeletal: No clubbing, no deformities   Neuro: A&Ox3, speech clear, gait stable, cooperative, no focal neurologic deficits   Skin: Skin color is normal. No rashes or lesions. Non diaphoretic, moist.   Vascular: 2+ pulses symmetric in all extremities            DIAGNOSTIC DATA         EKG:             LABORATORY DATA           Lab Results         Component  Value  Date/Time            WBC  5.8  05/09/2018 07:53 AM       HGB  13.3  05/09/2018 07:53 AM       HCT  37.9  05/09/2018 07:53 AM       PLATELET  194  05/09/2018 07:53 AM            MCV  94.5  05/09/2018 07:53 AM           Lab Results         Component  Value  Date/Time            Sodium  142  05/09/2018 07:52 AM       Potassium  3.9  05/09/2018 07:52 AM       Chloride  111 (H)  05/09/2018 07:52 AM       CO2  26  05/09/2018 07:52 AM       Anion gap  5  05/09/2018 07:52 AM       Glucose  96  05/09/2018 07:52 AM       BUN  13  05/09/2018 07:52 AM       Creatinine  0.91  05/09/2018 07:52 AM       BUN/Creatinine ratio  14  05/09/2018 07:52 AM       GFR est AA  >60  05/09/2018 07:52 AM       GFR est non-AA  >60  05/09/2018 07:52 AM       Calcium  8.8  05/09/2018 07:52 AM       Bilirubin, total  0.5  12/05/2017 12:00 AM       AST (SGOT)  55 (H)  12/05/2017 12:00 AM       Alk.  phosphatase  138 (H)  12/05/2017 12:00 AM       Protein, total  7.2  12/05/2017 12:00 AM       Albumin  4.4  12/05/2017 12:00 AM       Globulin  3.1  08/09/2017 03:45 AM       A-G Ratio  1.0 (L)  08/09/2017 03:45 AM            ALT (SGPT)  66 (H)  12/05/2017 12:00 AM  ASSESSMENT         1.??Cardiomyopathy   ????????????????????????A. NICM   ????????????????????????B. LVEF 20-25%   ????????????????????????C. NYHA II   ????????????????????????D. QRS duration 96 msec   2.??Atrial flutter   ????????????????????????A. DCCV   ????????????????????????B. Amiodarone   3.??Mitral regurgitation   ????????????????????????A. Moderate   ??         PLAN        Continue 30 day monitor to evaluate for additional arrhythmia for which Eliquis would need to be restarted. Continue metoprolol.         FOLLOW-UP     3 months      Thank you, Forrest Moron, MD and Dr. Mort Sawyers for allowing me to participate in the care of this extraordinarily pleasant  male. Please do not hesitate to contact me for further questions/concerns.          Grier Mitts, NP

## 2018-05-23 NOTE — Progress Notes (Signed)
Chief Complaint   Patient presents with   . Irregular Heart Beat     Visit Vitals  BP 118/74 (BP 1 Location: Left arm, BP Patient Position: Sitting)   Pulse 74   Ht 5\' 9"  (1.753 m)   Wt 154 lb (69.9 kg)   SpO2 97%   BMI 22.74 kg/m     Patient in offices  no cardiac complaints.    No hospital stays.    No cardiac refills

## 2018-07-06 ENCOUNTER — Encounter

## 2018-07-08 MED ORDER — METOPROLOL SUCCINATE SR 25 MG 24 HR TAB
25 mg | ORAL_TABLET | ORAL | 0 refills | Status: AC
Start: 2018-07-08 — End: ?

## 2018-07-08 NOTE — Telephone Encounter (Signed)
Patient last seen 12/05/17 by Dr. Darrol Angel with plan for follow up in 3 months. No scheduled follow up. Will provide 30 day supply no refills. Patient to follow up with [facility] for further refills. Beacher May, RN.     Requested Prescriptions     Signed Prescriptions Disp Refills   ??? metoprolol succinate (TOPROL-XL) 25 mg XL tablet 15 Tab 0     Sig: TAKE 1/2 TABLET BY MOUTH EVERY DAY     Authorizing Provider: Antony Contras     Ordering User: Elisabeth Pigeon

## 2018-07-31 ENCOUNTER — Telehealth
Admit: 2018-07-31 | Discharge: 2018-08-01 | Payer: PRIVATE HEALTH INSURANCE | Attending: Specialist | Primary: Student in an Organized Health Care Education/Training Program

## 2018-07-31 ENCOUNTER — Encounter: Primary: Student in an Organized Health Care Education/Training Program

## 2018-07-31 ENCOUNTER — Encounter: Attending: Specialist | Primary: Student in an Organized Health Care Education/Training Program

## 2018-07-31 ENCOUNTER — Telehealth: Attending: Specialist | Primary: Student in an Organized Health Care Education/Training Program

## 2018-07-31 DIAGNOSIS — I4891 Unspecified atrial fibrillation: Secondary | ICD-10-CM

## 2018-07-31 NOTE — Progress Notes (Signed)
Did not show up for VV    Steve Alligood, MD

## 2018-07-31 NOTE — Progress Notes (Signed)
Did not show up for VV    Rushie Chestnut, MD

## 2018-08-05 MED ORDER — LISINOPRIL 2.5 MG TAB
2.5 mg | ORAL_TABLET | Freq: Every day | ORAL | 3 refills | Status: AC
Start: 2018-08-05 — End: ?

## 2018-08-05 NOTE — Telephone Encounter (Signed)
Per VO of Dr. Irving Burton  LOV: 07/31/2018    Future Appointments   Date Time Provider Department Center   08/09/2018  3:00 PM Adair Laundry, MD [facility] SCHED   09/06/2018  2:00 PM ECHOTWO, [facility] SCHED       Requested Prescriptions     Pending Prescriptions Disp Refills   ??? lisinopriL (PRINIVIL, ZESTRIL) 2.5 mg tablet 30 Tab 3     Sig: Take 1 Tab by mouth daily.     Spoke to pt regarding upcoming visit.  Pt request refill to [facility].  Pt he has felt good. Denies CP, palpitations, SOB, dizziness. When reviewing meds for upcoming VV, pt reports he is continuing to take his Eliquis 2.5mg  bid.

## 2018-08-09 ENCOUNTER — Encounter: Attending: Cardiovascular Disease | Primary: Student in an Organized Health Care Education/Training Program

## 2018-08-09 ENCOUNTER — Telehealth
Admit: 2018-08-09 | Payer: PRIVATE HEALTH INSURANCE | Attending: Cardiovascular Disease | Primary: Student in an Organized Health Care Education/Training Program

## 2018-08-09 ENCOUNTER — Telehealth: Attending: Cardiovascular Disease | Primary: Student in an Organized Health Care Education/Training Program

## 2018-08-09 DIAGNOSIS — I4892 Unspecified atrial flutter: Secondary | ICD-10-CM

## 2018-08-09 NOTE — Telephone Encounter (Signed)
Noted. Message sent to Dr. Irving Burton to call patient for his appointment @ 3 pm today.

## 2018-08-09 NOTE — Progress Notes (Signed)
VIRTUAL VISIT DOCUMENTATION     Pursuant to the emergency declaration under the [place], 1135 waiver authority and the [employer] Act, this Virtual  Visit was conducted, with patient's consent, to reduce the patient's risk of exposure to COVID-19 and provide continuity of care for an established patient.     Services were provided through audio synchronous discussion virtually to substitute for in-person clinic visit.      HISTORY OF PRESENTING ILLNESS      Steve Green is a 58 y.o. male who was seen by synchronous (real-time) audio-video technology on 08/09/2018 with nonischemic cardiomyopathy, LVEF 15-20% (echocardiogram 08/07/17), moderate MR, atrial flutter s/p DCCV (on amiodarone) s/p CTI ablation and ICD therapy for primary prevention of sudden death. He is currently on guideline directed medical therapy. He is on eliquis for CVA risk reduction.  LV function improved to 39%. Monitoring demonstrated long RP tachycardias. He reported that he was cycling. He had some numbness in his hand post-ablation and his eliquis was restarted. Denied chest pain, SOB, edema, syncope, fatigue. Remains active without issue.        PAST MEDICAL HISTORY     Atrial flutter  Cardiomyopathy       PAST SURGICAL HISTORY     Past Surgical History:   Procedure Laterality Date   ??? PR EPHYS EVAL W/ABLATION SUPRAVENT ARRHYTHMIA N/A 05/09/2018    ABLATION A-FLUTTER performed by Gailen Shelter, MD at [facility] CATH LAB   ??? PR INTRACARDIAC ELECTROPHYSIOLOGIC 3D MAPPING N/A 05/09/2018    Ep 3d Mapping performed by Gailen Shelter, MD at [facility] CATH LAB          ALLERGIES     No Known Allergies       FAMILY HISTORY     Family History   Problem Relation Age of Onset   ??? Heart Disease Other     negative for cardiac disease       SOCIAL HISTORY     Social History     Socioeconomic History   ??? Marital status: SINGLE     Spouse name: Not on file    ??? Number of children: Not on file   ??? Years of education: Not on file   ??? Highest education level: Not on file   Tobacco Use   ??? Smoking status: Current Some Day Smoker     Packs/day: 0.50     Years: 30.00     Pack years: 15.00     Last attempt to quit: 07/13/2017     Years since quitting: 1.0   ??? Smokeless tobacco: Never Used   Substance and Sexual Activity   ??? Alcohol use: Yes     Alcohol/week: 24.0 standard drinks     Types: 24 Cans of beer per week   ??? Drug use: Never   ??? Sexual activity: Not Currently     Partners: Female         MEDICATIONS     Current Outpatient Medications   Medication Sig   ??? lisinopriL (PRINIVIL, ZESTRIL) 2.5 mg tablet Take 1 Tab by mouth daily.   ??? metoprolol succinate (TOPROL-XL) 25 mg XL tablet TAKE 1/2 TABLET BY MOUTH EVERY DAY     No current facility-administered medications for this visit.        I have reviewed the nurses notes, vitals, problem list, allergy list, medical history, family, social history and medications.  REVIEW OF SYMPTOMS      General: Pt denies excessive weight gain or loss. Pt is able to conduct ADL's  HEENT: Denies blurred vision, headaches, hearing loss, epistaxis and difficulty swallowing.  Respiratory: Denies cough, congestion, shortness of breath, DOE, wheezing or stridor.  Cardiovascular: Denies precordial pain, palpitations, edema or PND  Gastrointestinal: Denies poor appetite, indigestion, abdominal pain or blood in stool  Genitourinary: Denies hematuria, dysuria, increased urinary frequency  Musculoskeletal: Denies joint pain or swelling from muscles or joints  Neurologic: Denies tremor, paresthesias, headache, or sensory motor disturbance  Psychiatric: Denies confusion, insomnia, depression  Integumentray: Denies rash, itching or ulcers.  Hematologic: Denies easy bruising, bleeding       PHYSICAL EXAMINATION      Due to this being a TeleHealth evaluation, PE was not performed     DIAGNOSTIC DATA                                 LABORATORY DATA       Lab Results   Component Value Date/Time    WBC 5.8 05/09/2018 07:53 AM    HGB 13.3 05/09/2018 07:53 AM    HCT 37.9 05/09/2018 07:53 AM    PLATELET 194 05/09/2018 07:53 AM    MCV 94.5 05/09/2018 07:53 AM      Lab Results   Component Value Date/Time    Sodium 142 05/09/2018 07:52 AM    Potassium 3.9 05/09/2018 07:52 AM    Chloride 111 (H) 05/09/2018 07:52 AM    CO2 26 05/09/2018 07:52 AM    Anion gap 5 05/09/2018 07:52 AM    Glucose 96 05/09/2018 07:52 AM    BUN 13 05/09/2018 07:52 AM    Creatinine 0.91 05/09/2018 07:52 AM    BUN/Creatinine ratio 14 05/09/2018 07:52 AM    GFR est AA >60 05/09/2018 07:52 AM    GFR est non-AA >60 05/09/2018 07:52 AM    Calcium 8.8 05/09/2018 07:52 AM    Bilirubin, total 0.5 12/05/2017 12:00 AM    AST (SGOT) 55 (H) 12/05/2017 12:00 AM    Alk. phosphatase 138 (H) 12/05/2017 12:00 AM    Protein, total 7.2 12/05/2017 12:00 AM    Albumin 4.4 12/05/2017 12:00 AM    Globulin 3.1 08/09/2017 03:45 AM    A-G Ratio 1.0 (L) 08/09/2017 03:45 AM    ALT (SGPT) 66 (H) 12/05/2017 12:00 AM           ASSESSMENT      1. Cardiomyopathy              A. NICM              B. LVEF 20-25%-->39%              C. NYHA II              D. QRS duration 96 msec  2. Atrial flutter              A. DCCV              B. Amiodarone   C. CTI ablation 04/2018  3. Mitral regurgitation              A. Moderate        ICD-10-CM ICD-9-CM    1. Atrial flutter, unspecified type ([place]) I48.92 427.32    2. NICM (nonischemic cardiomyopathy) ([facility]) I42.8 425.4    3. S/P ablation of  atrial flutter Z98.890 V45.89     Z86.79     4. Systolic CHF, chronic ([facility]) I50.22 428.22      428.0      No orders of the defined types were placed in this encounter.       PLAN     Trial of discontinuing eliquis once again and if numbness recurs would have him potentially visit with Neurology.     We discussed the expected course, resolution and complications of the diagnosis(es) in detail.  Medication risks, benefits, costs, interactions,  and alternatives were discussed as indicated.  I advised him to contact the office if his condition worsens, changes or fails to improve as anticipated. He expressed understanding with the diagnosis(es) and plan     FOLLOW-UP     1 year      Patient was made aware and verbalized understanding that an appointment will be scheduled for them for a virtual visit and/or office visit within the above time frame. Patient understanding his/her responsibility to call and change time/date if he/she so chooses.    Thank you, Forrest Moron, MD and Dr. Mort Sawyers for allowing me to participate in the care of this extraordinarily pleasant male.         Candis Schatz, MD  Cardiac Electrophysiology / Cardiology    [facility]  [address], [address], [place]    [place], [place]  [phone] / [phone] Fax  [phone] / [phone] Fax        Greater than 20 minutes was spent in direct audio patient care, planning and chart review.  This visit was conducted using [employer] telemedicine services.

## 2018-08-09 NOTE — Telephone Encounter (Signed)
Patient states he doesn't have video capabilities and can only do a telephone call at [facility] today.

## 2018-08-09 NOTE — Progress Notes (Signed)
Progress  Notes by Gailen Shelter, MD at 08/09/18 1500                Author: Gailen Shelter, MD  Service: --  Author Type: Physician       Filed: 08/09/18 1510  Encounter Date: 08/09/2018  Status: Signed          Editor: Gailen Shelter, MD (Physician)                            VIRTUAL VISIT DOCUMENTATION        Pursuant to the emergency declaration under the [place], 1135 waiver authority and the Coronavirus Preparedness and [employer] Act, this Virtual  Visit was conducted, with patient's consent,  to reduce the patient's risk of exposure to COVID-19 and provide continuity of care for an established patient.       Services were provided through audio synchronous discussion virtually to substitute for in-person clinic visit.           HISTORY OF PRESENTING ILLNESS         Steve Green is a 58 y.o.  male who was seen by synchronous (real-time) audio-video technology on 08/09/2018 with nonischemic  cardiomyopathy, LVEF 15-20% (echocardiogram 08/07/17), moderate MR, atrial flutter s/p DCCV (on amiodarone) s/p CTI ablation and ICD therapy for primary prevention of sudden death. He is currently on guideline directed medical therapy. He is on eliquis  for CVA risk reduction.  LV function improved to 39%. Monitoring demonstrated long RP tachycardias. He reported that he was cycling. He had some numbness in his hand post-ablation and his eliquis was restarted. Denied chest pain, SOB, edema, syncope,  fatigue. Remains active without issue.             PAST MEDICAL HISTORY        Atrial flutter   Cardiomyopathy            PAST SURGICAL HISTORY          Past Surgical History:         Procedure  Laterality  Date          ?  PR EPHYS EVAL W/ABLATION SUPRAVENT ARRHYTHMIA  N/A  05/09/2018          ABLATION A-FLUTTER performed by Gailen Shelter, MD at [facility]          ?  PR INTRACARDIAC ELECTROPHYSIOLOGIC 3D MAPPING  N/A  05/09/2018          Ep 3d Mapping  performed by Gailen Shelter, MD at [facility]                 ALLERGIES        No Known Allergies            FAMILY HISTORY          Family History         Problem  Relation  Age of Onset          ?  Heart Disease  Other        negative for cardiac disease            SOCIAL HISTORY          Social History          Socioeconomic History         ?  Marital status:  SINGLE              Spouse name:  Not on file         ?  Number of children:  Not on file     ?  Years of education:  Not on file     ?  Highest education level:  Not on file       Tobacco Use         ?  Smoking status:  Current Some Day Smoker              Packs/day:  0.50         Years:  30.00         Pack years:  15.00         Last attempt to quit:  07/13/2017         Years since quitting:  1.0         ?  Smokeless tobacco:  Never Used       Substance and Sexual Activity         ?  Alcohol use:  Yes              Alcohol/week:  24.0 standard drinks         Types:  24 Cans of beer per week         ?  Drug use:  Never     ?  Sexual activity:  Not Currently              Partners:  Female                MEDICATIONS          Current Outpatient Medications        Medication  Sig         ?  lisinopriL (PRINIVIL, ZESTRIL) 2.5 mg tablet  Take 1 Tab by mouth daily.         ?  metoprolol succinate (TOPROL-XL) 25 mg XL tablet  TAKE 1/2 TABLET BY MOUTH EVERY DAY          No current facility-administered medications for this visit.            I have reviewed the nurses notes, vitals, problem list, allergy list, medical history, family, social history and medications.            REVIEW OF SYMPTOMS         General: Pt denies excessive weight gain or loss. Pt is able to conduct ADL's   HEENT: Denies blurred vision, headaches, hearing loss, epistaxis and difficulty swallowing.   Respiratory: Denies cough, congestion, shortness of breath, DOE, wheezing or stridor.   Cardiovascular: Denies precordial pain, palpitations, edema or PND   Gastrointestinal: Denies poor  appetite, indigestion, abdominal pain or blood in stool   Genitourinary: Denies hematuria, dysuria, increased urinary frequency   Musculoskeletal: Denies joint pain or swelling from muscles or joints   Neurologic: Denies tremor, paresthesias, headache, or sensory motor disturbance   Psychiatric: Denies confusion, insomnia, depression   Integumentray: Denies rash, itching or ulcers.   Hematologic: Denies easy bruising, bleeding            PHYSICAL EXAMINATION         Due to this being a TeleHealth evaluation, PE was not performed         DIAGNOSTIC DATA  LABORATORY DATA           Lab Results         Component  Value  Date/Time            WBC  5.8  05/09/2018 07:53 AM       HGB  13.3  05/09/2018 07:53 AM       HCT  37.9  05/09/2018 07:53 AM       PLATELET  194  05/09/2018 07:53 AM            MCV  94.5  05/09/2018 07:53 AM           Lab Results         Component  Value  Date/Time            Sodium  142  05/09/2018 07:52 AM       Potassium  3.9  05/09/2018 07:52 AM       Chloride  111 (H)  05/09/2018 07:52 AM       CO2  26  05/09/2018 07:52 AM       Anion gap  5  05/09/2018 07:52 AM       Glucose  96  05/09/2018 07:52 AM       BUN  13  05/09/2018 07:52 AM       Creatinine  0.91  05/09/2018 07:52 AM       BUN/Creatinine ratio  14  05/09/2018 07:52 AM       GFR est AA  >60  05/09/2018 07:52 AM       GFR est non-AA  >60  05/09/2018 07:52 AM       Calcium  8.8  05/09/2018 07:52 AM       Bilirubin, total  0.5  12/05/2017 12:00 AM       AST (SGOT)  55 (H)  12/05/2017 12:00 AM       Alk. phosphatase  138 (H)  12/05/2017 12:00 AM       Protein, total  7.2  12/05/2017 12:00 AM       Albumin  4.4  12/05/2017 12:00 AM       Globulin  3.1  08/09/2017 03:45 AM       A-G Ratio  1.0 (L)  08/09/2017 03:45 AM            ALT (SGPT)  66 (H)  12/05/2017 12:00 AM                  ASSESSMENT         1. Cardiomyopathy               A. NICM               B. LVEF 20-25%-->39%                C. NYHA II               D. QRS duration 96 msec   2. Atrial flutter               A. DCCV               B. Amiodarone    C. CTI ablation 04/2018   3. Mitral regurgitation               A. Moderate                   ICD-10-CM  ICD-9-CM  1.  Atrial flutter, unspecified type ([place])  I48.92  427.32       2.  NICM (nonischemic cardiomyopathy) (HCC)  I42.8  425.4       3.  S/P ablation of atrial flutter  Z98.890  V45.89              Z86.79               4.  Systolic CHF, chronic (HCC)  I50.22  428.22                428.0          No orders of the defined types were placed in this encounter.            PLAN        Trial of discontinuing eliquis once again and if numbness recurs would have him potentially visit with Neurology.       We discussed the expected course, resolution and complications of the diagnosis(es) in detail.  Medication risks, benefits, costs, interactions, and alternatives were discussed as indicated.  I advised  him to contact the office if his condition worsens, changes or fails to improve as anticipated.  He expressed understanding with the diagnosis(es) and plan         FOLLOW-UP        1 year         Patient was made aware and verbalized understanding that an appointment will be scheduled for them for a virtual visit and/or office visit within the above time frame. Patient understanding his/her responsibility to call and change time/date if he/she  so chooses.      Thank you, Forrest Moron, MD and Dr. Mort Sawyers for allowing me to participate  in the care of this extraordinarily pleasant male.             Candis Schatz, MD   Cardiac Electrophysiology / Cardiology      [facility]   [address], [address], [place]    [place], [place]   [phone] / [phone] Fax  [phone] / [phone] Fax            Greater than 20 minutes was  spent in direct audio patient care, planning and chart review.   This visit was conducted using [employer] telemedicine services.

## 2018-09-05 ENCOUNTER — Encounter

## 2018-09-05 NOTE — Telephone Encounter (Signed)
Good afternoon,    Patient's echo scheduled for tomorrow is currently Pending. The request is being reviewed by the clinicians.    Please notify the patient and cancel the test. Will update once a decision has been rendered.    Thanks,  Jesse Sans'

## 2018-09-06 ENCOUNTER — Encounter: Primary: Student in an Organized Health Care Education/Training Program

## 2018-09-06 NOTE — Telephone Encounter (Signed)
Joseph-Pauline, Ernestine B  Raymond, [place]; McWalters, Winton, RN 9 hours ago (7:45 AM)      Please remember when situations come up like this with the appointment being the next day and it is after I leave for the day, it needs to be either handled as soon as possible or advise either Nichole or Kelly B of the situation. The patient was only coming in for an echo.     Thank you,     Veva Holes

## 2018-09-09 NOTE — Telephone Encounter (Signed)
Good afternoon,    Patient's echo has been APPROVED and can now be rescheduled.    Thanks,  Jesse Sans'

## 2018-09-10 NOTE — Telephone Encounter (Signed)
Joseph-Pauline, Sanjuana Letters, LPN   Caller: Unspecified (Yesterday, ??3:51 PM)      ??      Called pt ??l/m to let him know his echo was approved and to call us back. ??Anyone that answers the phone can schedule it. Thanks

## 2019-02-14 IMAGING — US DOP VENOUS LWR EXT RT
1 series · 14 of 24 positions shown · non-contrast
Comparison: Right lower extremity DVT ultrasound from 03/20/2014 is negative.

HISTORY: Right leg swelling
TECHNIQUE: Right lower extremity Doppler venous ultrasound was performed with real-time, gray-scale, color-flow Doppler and spectral analysis

[Series 1: dop venous lwr ext right · portal-venous · 14 of 28 slices shown]
[im 1/28]
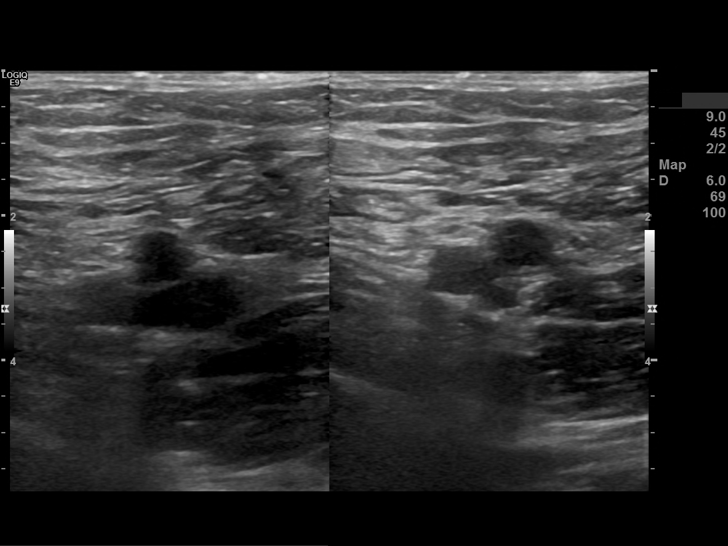
[im 3/28]
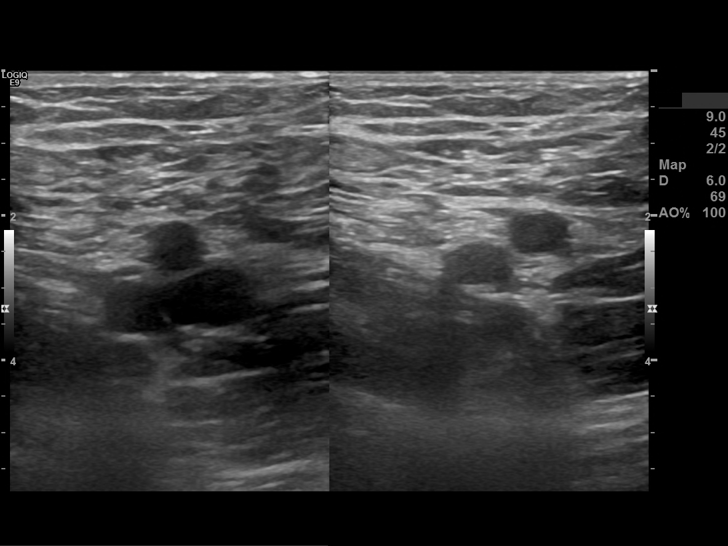
[im 5/28]
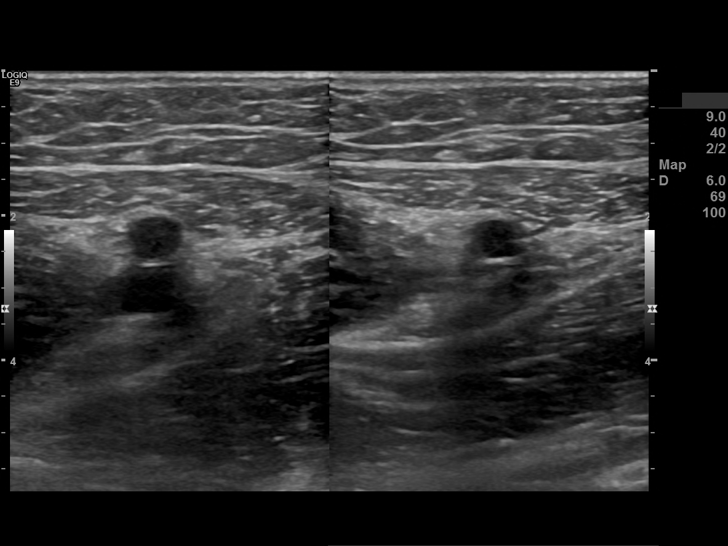
[im 8/28]
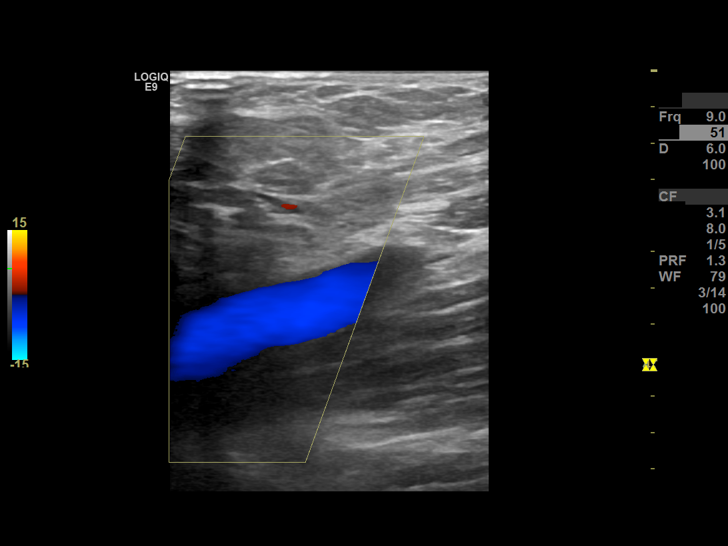
[im 9/28]
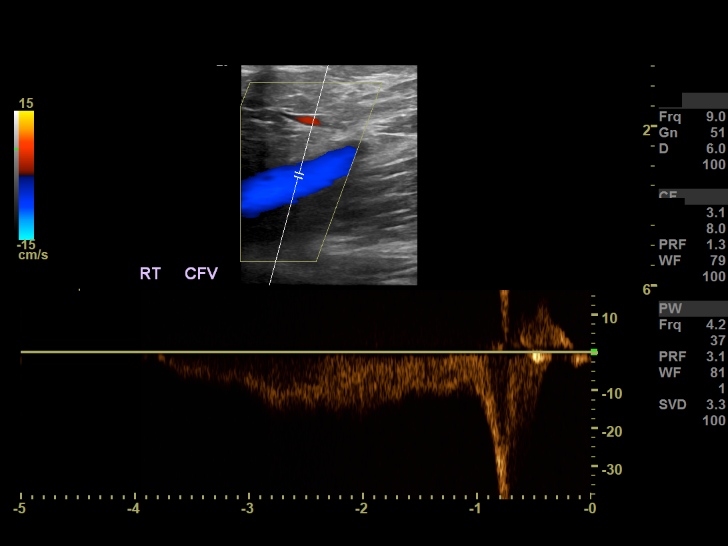
[im 11/28]
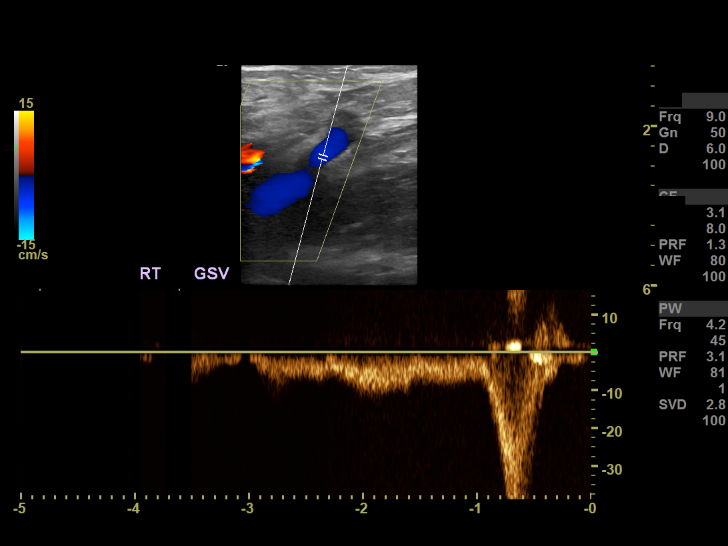
[im 13/28]
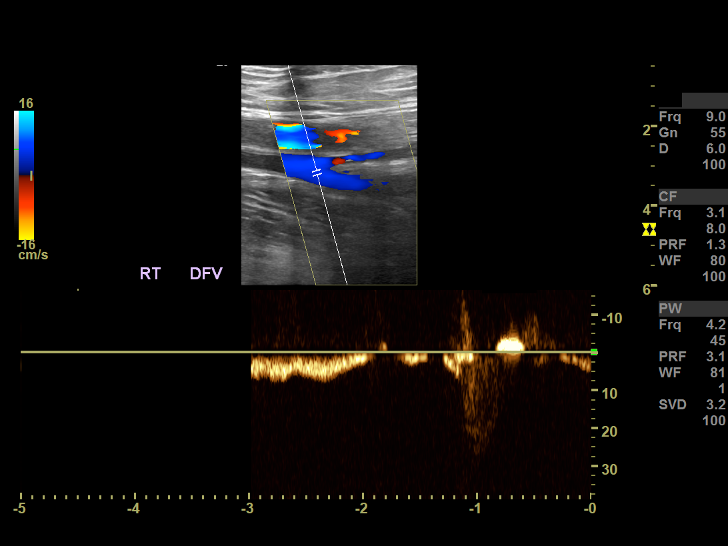
[im 15/28]
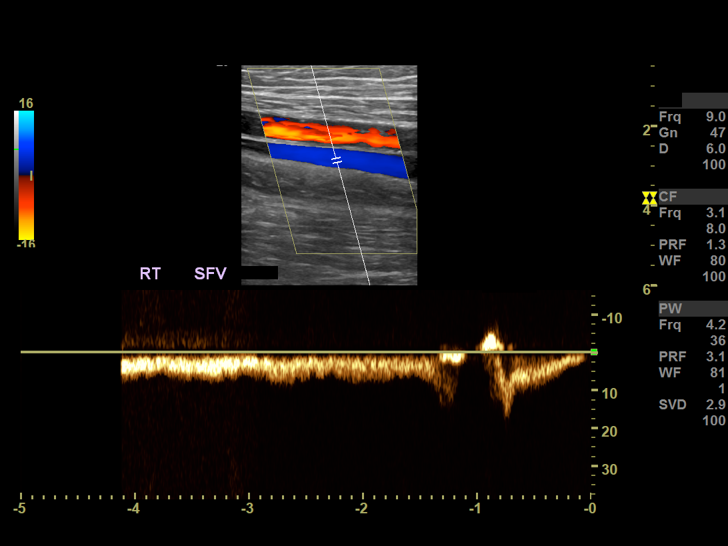
[im 17/28]
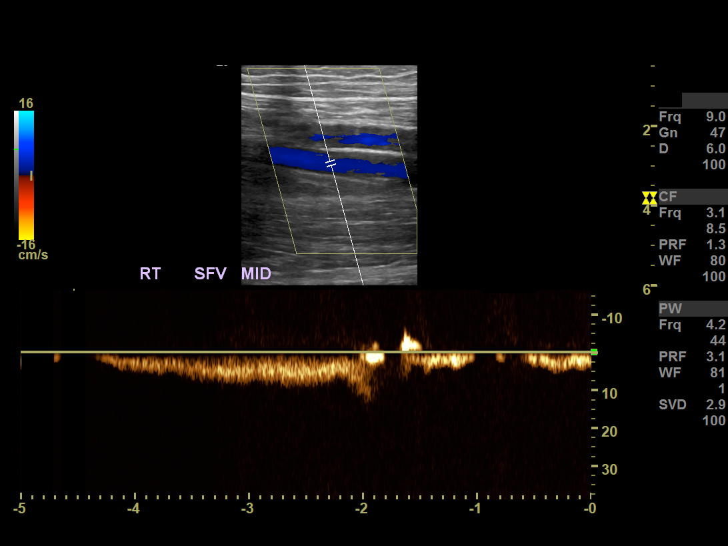
[im 19/28]
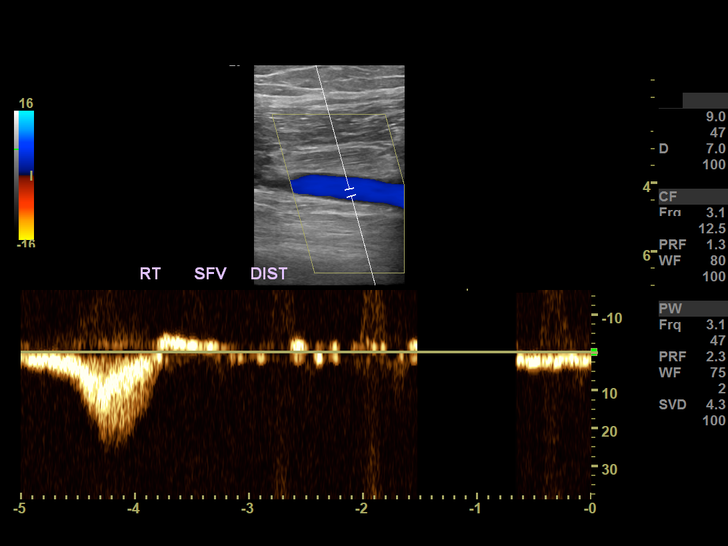
[im 22/28]
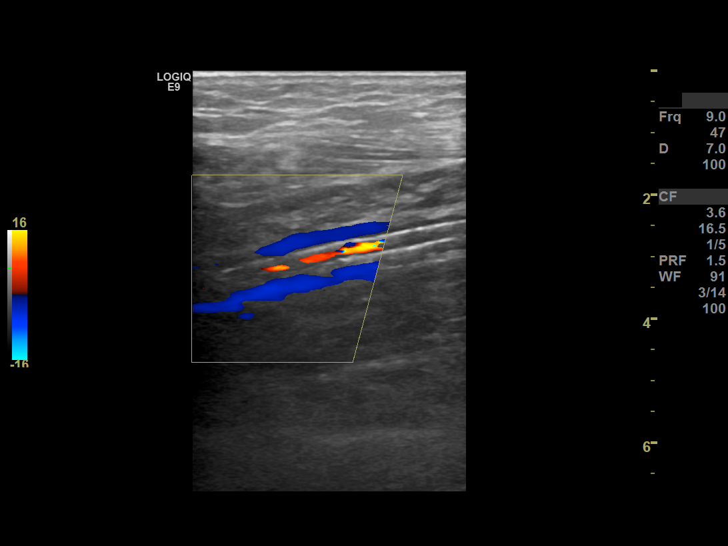
[im 23/28]
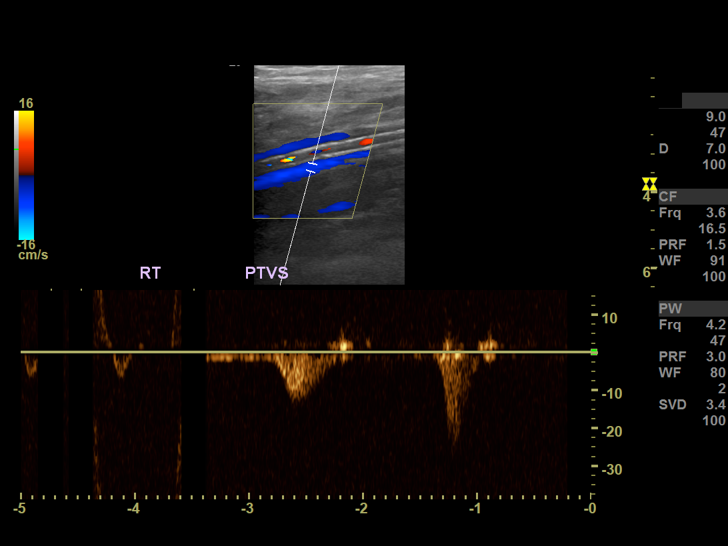
[im 25/28]
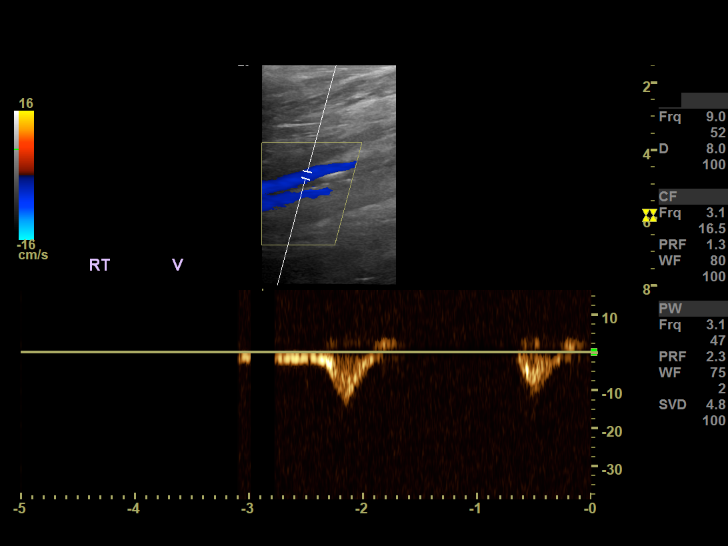
[im 28/28]
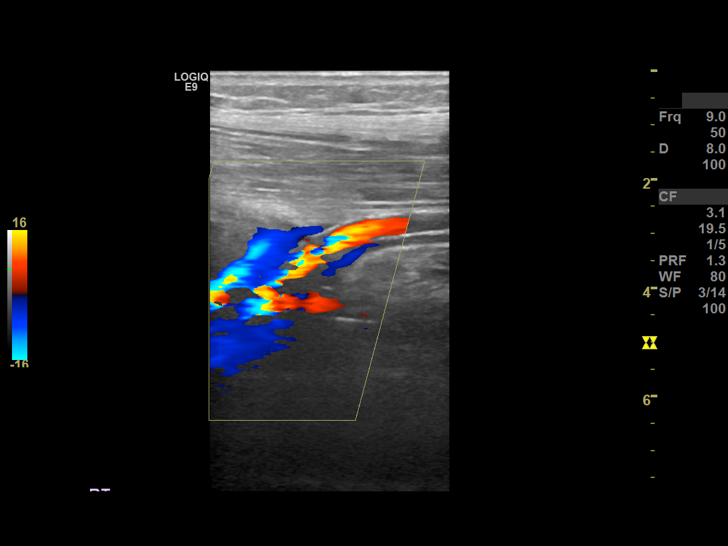

[14 of 24 positions shown; findings below may reference images not displayed]

FINDINGS: The veins of the right lower extremity were interrogated including the common femoral, superficial femoral, popliteal and saphenous veins.  Flow is present in these vessels.  They demonstrate normal compressibility and augmentation of flow with appropriate maneuvers. Normal respiratory variation of flow is noted
IMPRESSION: No evidence of deep venous thrombosis of the right lower extremity, stable with March 2014 exam.

## 2019-08-13 ENCOUNTER — Ambulatory Visit
Payer: MEDICAID | Attending: Cardiovascular Disease | Primary: Student in an Organized Health Care Education/Training Program

## 2019-08-13 NOTE — Progress Notes (Deleted)
HISTORY OF PRESENTING ILLNESS      Steve Green is a 59 y.o. male with nonischemic cardiomyopathy, LVEF 15-20% (echocardiogram 08/07/17), moderate MR, atrial flutter s/p DCCV (on amiodarone) s/p CTI ablation and ICD therapy for primary prevention of sudden death. He is currently on guideline directed medical therapy. He is on eliquis for CVA risk reduction.  LV function improved to 39%. Monitoring demonstrated long RP tachycardias. He reported that he was cycling. He had some numbness in his hand post-ablation and his eliquis was restarted.     Trial of discontinuing eliquis once again and if numbness recurs would have him potentially visit with Neurology.           PAST MEDICAL HISTORY     Past Medical History:   Diagnosis Date   ??? A-fib ([place])    ??? Hypertension            PAST SURGICAL HISTORY     Past Surgical History:   Procedure Laterality Date   ??? PR EPHYS EVAL W/ABLATION SUPRAVENT ARRHYTHMIA N/A 05/09/2018    ABLATION A-FLUTTER performed by Gailen Shelter, MD at [facility]   ??? PR INTRACARDIAC ELECTROPHYSIOLOGIC 3D MAPPING N/A 05/09/2018    Ep 3d Mapping performed by Gailen Shelter, MD at [facility]          ALLERGIES     No Known Allergies       FAMILY HISTORY     Family History   Problem Relation Age of Onset   ??? Heart Disease Other     negative for cardiac disease       SOCIAL HISTORY     Social History     Socioeconomic History   ??? Marital status: SINGLE     Spouse name: Not on file   ??? Number of children: Not on file   ??? Years of education: Not on file   ??? Highest education level: Not on file   Tobacco Use   ??? Smoking status: Current Some Day Smoker     Packs/day: 0.50     Years: 30.00     Pack years: 15.00     Last attempt to quit: 07/13/2017     Years since quitting: 2.0   ??? Smokeless tobacco: Never Used   Substance and Sexual Activity   ??? Alcohol use: Yes     Alcohol/week: 24.0 standard drinks     Types: 24 Cans of beer per week   ??? Drug use: Never   ??? Sexual activity: Not  Currently     Partners: Female         MEDICATIONS     Current Outpatient Medications   Medication Sig   ??? lisinopriL (PRINIVIL, ZESTRIL) 2.5 mg tablet Take 1 Tab by mouth daily.   ??? metoprolol succinate (TOPROL-XL) 25 mg XL tablet TAKE 1/2 TABLET BY MOUTH EVERY DAY     No current facility-administered medications for this visit.        I have reviewed the nurses notes, vitals, problem list, allergy list, medical history, family, social history and medications.       REVIEW OF SYMPTOMS      General: Pt denies excessive weight gain or loss. Pt is able to conduct ADL's  HEENT: Denies blurred vision, headaches, hearing loss, epistaxis and difficulty swallowing.  Respiratory: Denies cough, congestion, shortness of breath, DOE, wheezing or stridor.  Cardiovascular: Denies precordial pain, palpitations, edema or PND  Gastrointestinal: Denies poor appetite,  indigestion, abdominal pain or blood in stool  Genitourinary: Denies hematuria, dysuria, increased urinary frequency  Musculoskeletal: Denies joint pain or swelling from muscles or joints  Neurologic: Denies tremor, paresthesias, headache, or sensory motor disturbance  Psychiatric: Denies confusion, insomnia, depression  Integumentray: Denies rash, itching or ulcers.  Hematologic: Denies easy bruising, bleeding       PHYSICAL EXAMINATION      Vitals: see vitals section  General: Well developed, in no acute distress.  HEENT: No jaundice, oral mucosa moist, no oral ulcers  Neck: Supple, no stiffness, no lymphadenopathy, supple  Heart:  Normal S1/S2 negative S3 or S4. Regular, no murmur, gallop or rub, no jugular venous distention  Respiratory: Clear bilaterally x 4, no wheezing or rales  Abdomen:   Soft, non-tender, bowel sounds are active.   Extremities:  No edema, normal cap refill, no cyanosis.  Musculoskeletal: No clubbing, no deformities  Neuro: A&Ox3, speech clear, gait stable, cooperative, no focal neurologic deficits  Skin: Skin color is normal. No rashes or  lesions. Non diaphoretic, moist.  Vascular: 2+ pulses symmetric in all extremities       DIAGNOSTIC DATA      EKG:        LABORATORY DATA      Lab Results   Component Value Date/Time    WBC 5.8 05/09/2018 07:53 AM    HGB 13.3 05/09/2018 07:53 AM    HCT 37.9 05/09/2018 07:53 AM    PLATELET 194 05/09/2018 07:53 AM    MCV 94.5 05/09/2018 07:53 AM      Lab Results   Component Value Date/Time    Sodium 142 05/09/2018 07:52 AM    Potassium 3.9 05/09/2018 07:52 AM    Chloride 111 (H) 05/09/2018 07:52 AM    CO2 26 05/09/2018 07:52 AM    Anion gap 5 05/09/2018 07:52 AM    Glucose 96 05/09/2018 07:52 AM    BUN 13 05/09/2018 07:52 AM    Creatinine 0.91 05/09/2018 07:52 AM    BUN/Creatinine ratio 14 05/09/2018 07:52 AM    GFR est AA >60 05/09/2018 07:52 AM    GFR est non-AA >60 05/09/2018 07:52 AM    Calcium 8.8 05/09/2018 07:52 AM    Bilirubin, total 0.5 12/05/2017 12:00 AM    Alk. phosphatase 138 (H) 12/05/2017 12:00 AM    Protein, total 7.2 12/05/2017 12:00 AM    Albumin 4.4 12/05/2017 12:00 AM    Globulin 3.1 08/09/2017 03:45 AM    A-G Ratio 1.0 (L) 08/09/2017 03:45 AM    ALT (SGPT) 66 (H) 12/05/2017 12:00 AM           ASSESSMENT      1. Cardiomyopathy              A. NICM              B. LVEF 20-25%-->39%              C. NYHA II              D. QRS duration 96 msec  2. Atrial flutter              A. DCCV              B. Amiodarone              C. CTI ablation 04/2018  3. Mitral regurgitation              A. Moderate  4. ICD  PLAN         {No Diagnosis Found}  No orders of the defined types were placed in this encounter.         FOLLOW-UP       Thank you, Forrest Moron, MD and Dr. Mort Sawyers   for allowing me to participate in the care of this extraordinarily pleasant male. Please do not hesitate to contact me for further questions/concerns.         Candis Schatz, MD  Cardiac Electrophysiology / Cardiology    [facility]  [address],  [address], [address]  [place], [place]    [place], [place]  [phone] / [phone] Fax   [phone] / [phone] Fax

## 2020-06-08 DEATH — deceased

## 2020-09-03 IMAGING — MR MRI SHOULDER RT WO CONTRAST
4 of 5 series · 29 of 40 positions shown · non-contrast
Comparison: None available.

INDICATION: Pain in right shoulder.
TECHNIQUE: Multiplanar, multisequence MR imaging of the right shoulder without contrast.

[Series 5: t2_axial_fs · axial · right · 3.0mm · 0.50mm/px · z∈[-59,+14]mm · 6 of 20 slices shown]
[im 1/20]
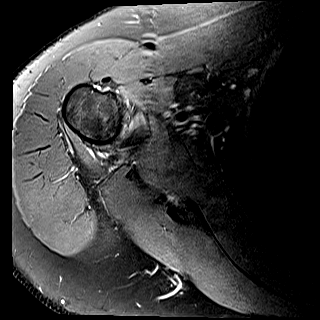
[im 4/20]
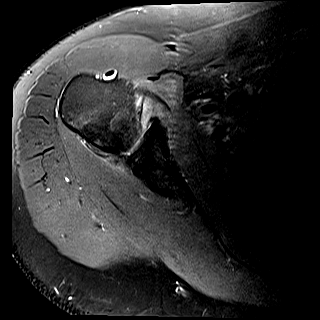
[im 8/20]
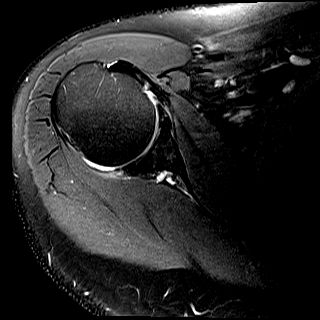
[im 12/20]
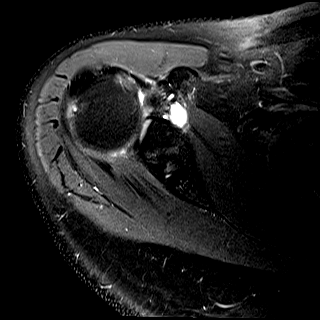
[im 16/20]
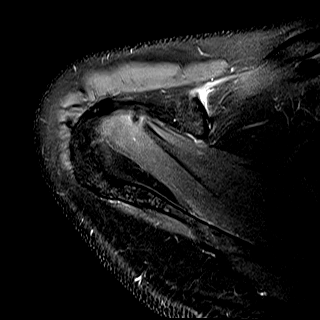
[im 20/20]
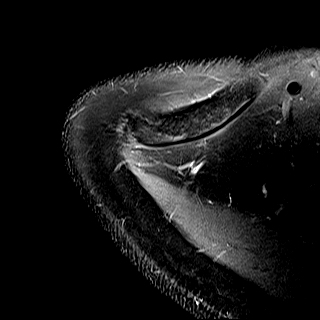

[Series 6: t2_cor_obl_fs · oblique · right · 3.0mm · 0.44mm/px · 5 of 17 slices shown]
[im 1/17]
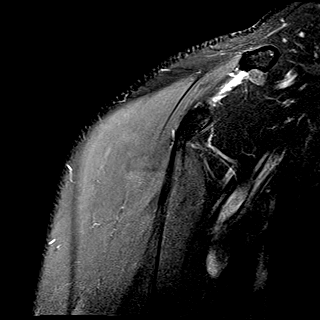
[im 5/17]
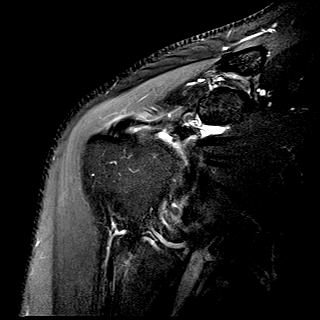
[im 9/17]
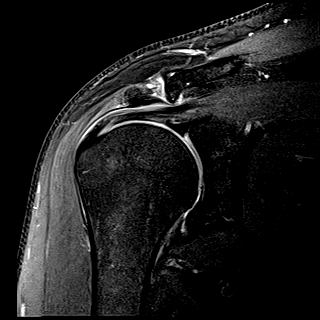
[im 13/17]
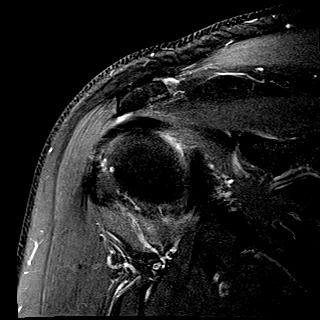
[im 17/17]
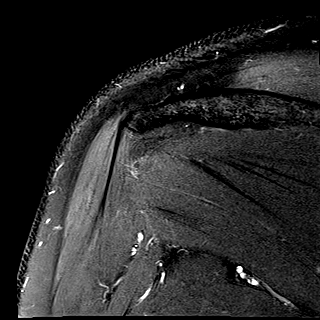

[Series 7: t2_sag_obl_fs · oblique · right · 3.0mm · 0.44mm/px · 9 of 28 slices shown]
[im 1/28]
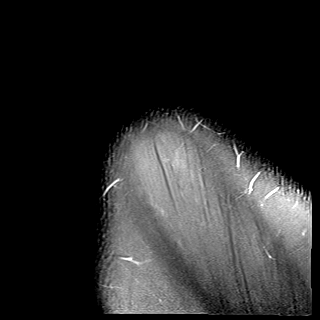
[im 4/28]
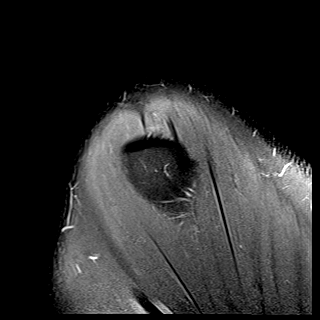
[im 7/28]
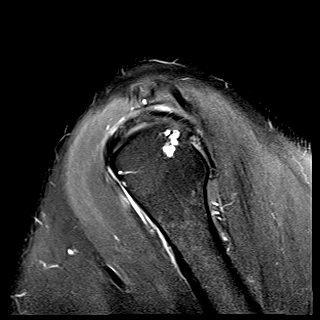
[im 11/28]
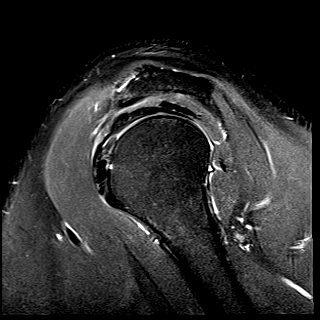
[im 14/28]
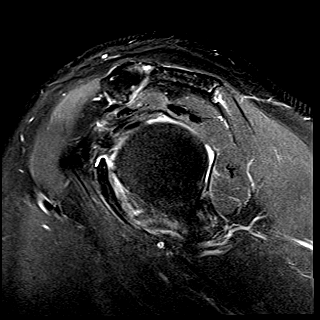
[im 17/28]
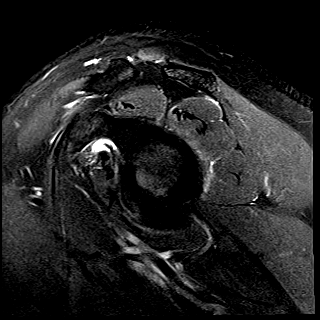
[im 21/28]
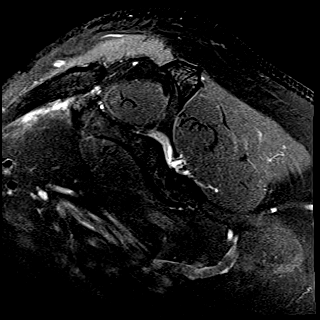
[im 24/28]
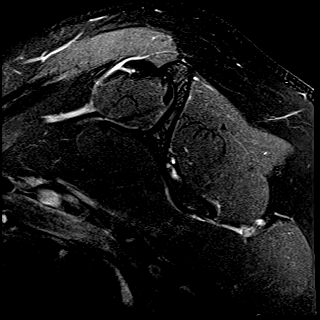
[im 28/28]
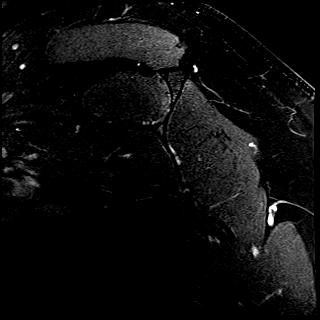

[Series 8: t1_sag_obl · oblique · right · 3.0mm · 0.36mm/px · 9 of 28 slices shown]
[im 1/28]
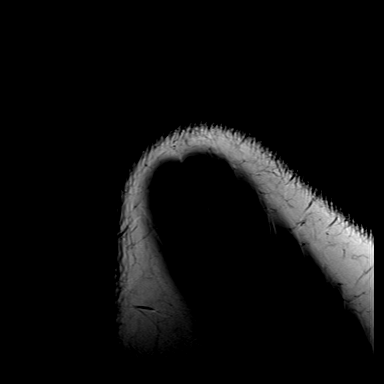
[im 4/28]
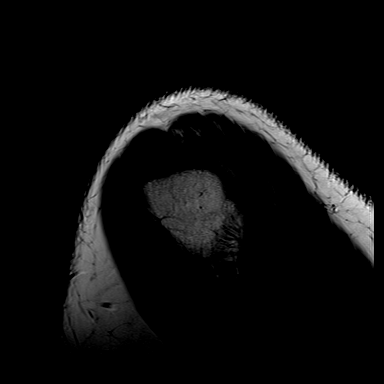
[im 7/28]
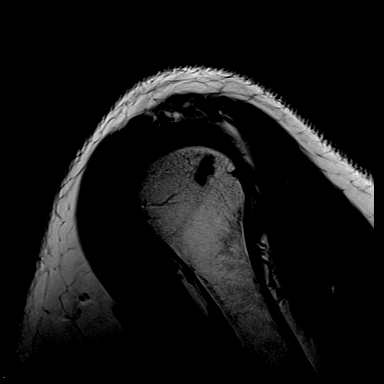
[im 11/28]
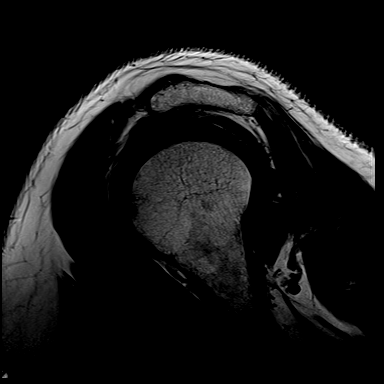
[im 14/28]
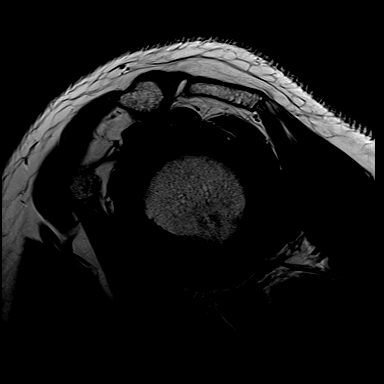
[im 17/28]
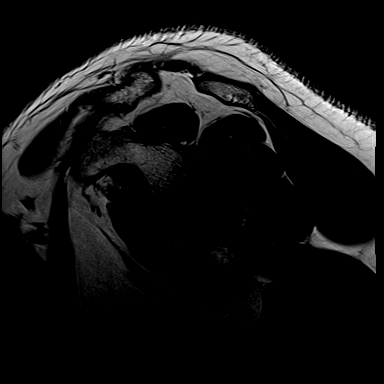
[im 21/28]
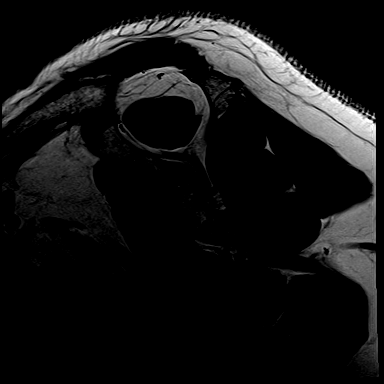
[im 24/28]
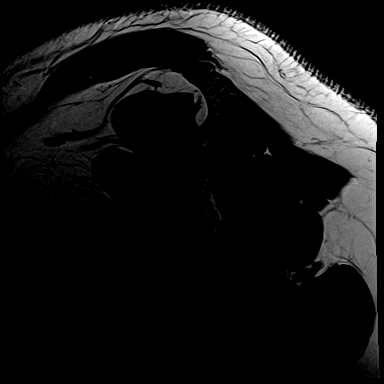
[im 28/28]
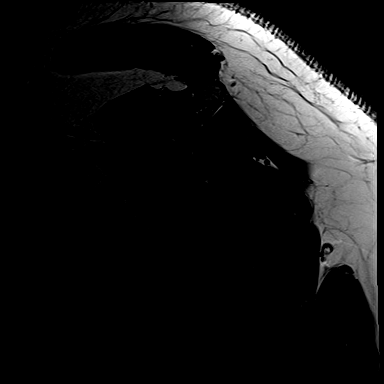

[29 of 40 positions shown; findings below may reference images not displayed]

FINDINGS: OSSEOUS: No acute fracture, avascular necrosis or aggressive osseous lesion. 

ACROMIAL OUTLET: Mild acromioclavicular joint osteoarthritis. Trace fluid is present within the subacromial/subdeltoid bursa. Mild anterior hooking of the acromion. The coracoclavicular and coracoacromial ligaments are intact.

ROTATOR CUFF:

Supraspinatus and infraspinatus: Intact.

Teres minor: Intact.

Subscapularis: Intact.

Fatty atrophy: The rotator cuff muscles are maintained.

LABRUM: Blunting and degeneration of the anterior inferior labrum. No discrete tear.

BICEPS TENDON: Mild proximal intra-articular long head biceps tendinosis. The proximal extra-articular long head biceps tendon is intact and normally located.

GLENOHUMERAL JOINT: The glenohumeral articular cartilage is maintained. No focal chondral defect is identified. The glenohumeral joint is normal in alignment.

OTHER: Mild thickening of the inferior glenohumeral ligament, with mild periligamentous edema along the anterior band of the inferior glenohumeral ligament. No glenohumeral joint effusion.
IMPRESSION: 1.
Intact rotator cuff.

2.
Mild thickening of the inferior glenohumeral ligament, with mild periligamentous edema along the anterior band of the inferior glenohumeral ligament. Correlate with clinical signs of adhesive capsulitis.

3.
Mild proximal intra-articular long head biceps tendinosis.

4.
Mild acromioclavicular joint osteoarthritis.

## 2023-01-24 IMAGING — MR MRA NECK WO/W CONTRAST
7 series · 48 of 48 positions shown · IV contrast (20cc prohance)
Comparison: None.

Images Obtained from [facility]
HISTORY: 61 years-old Male with Syncope and collapse.
TECHNIQUE: MR angiography study of the neck was performed first without contrast followed by IV administration 20 cc of ProHance. 2-D and 3-D reconstructed images were also obtained. Post-processing
software generated rotating 3-D and MIP images.

[Series 6: tof_fl2d_axials · axial · B · 3.0mm · 0.86mm/px · z∈[-124,+34]mm · 6 of 80 slices shown]
[im 1/80]
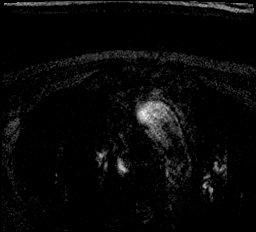
[im 16/80]
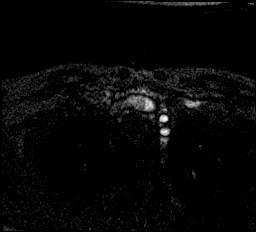
[im 32/80]
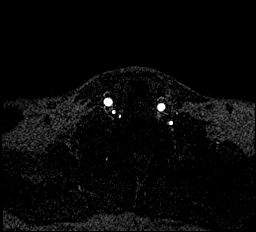
[im 48/80]
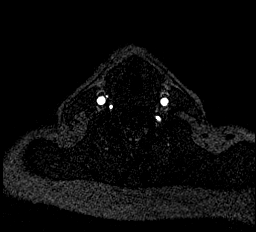
[im 64/80]
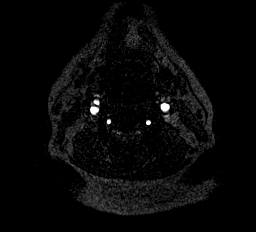
[im 80/80]
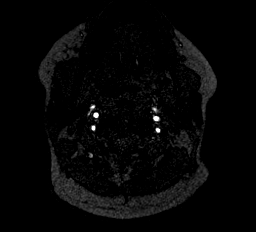

[Series 11: t1_vibe_(person_name)_axial_w · axial · B · 2.0mm · 0.89mm/px · z∈[-124,+50]mm · 7 of 88 slices shown]
[im 1/88]
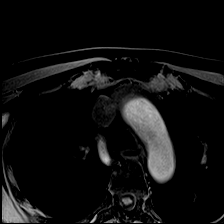
[im 15/88]
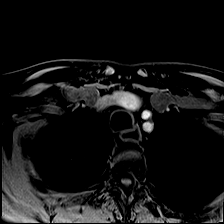
[im 30/88]
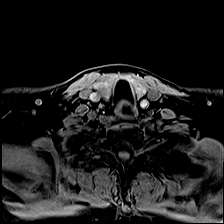
[im 44/88]
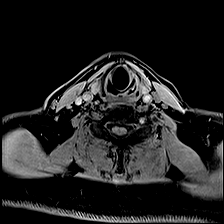
[im 59/88]
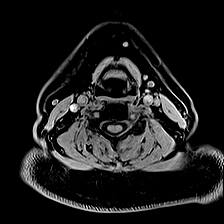
[im 73/88]
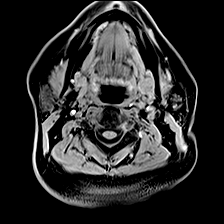
[im 88/88]
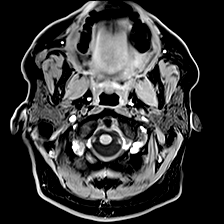

[Series 15: fl3d_ce_cor_+c_ttc=2.0s_moco-adv · coronal · B · 1.0mm · 0.94mm/px · 7 of 88 slices shown (1 of 2)]
[im 1/88]
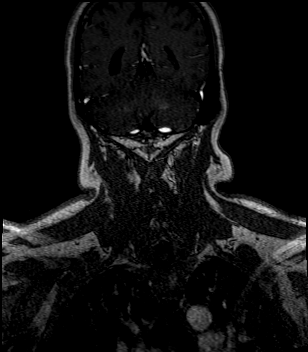
[im 15/88]
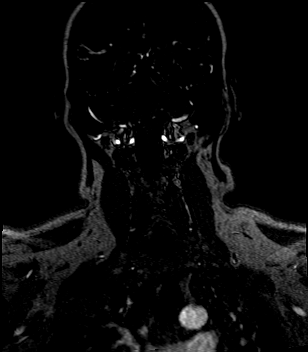
[im 30/88]
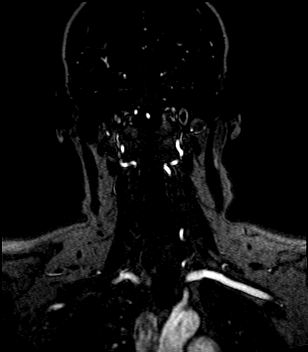
[im 44/88]
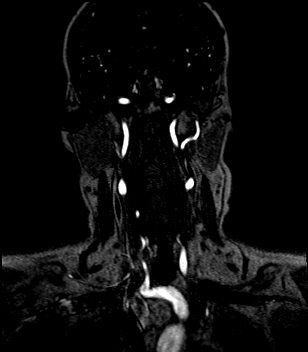
[im 59/88]
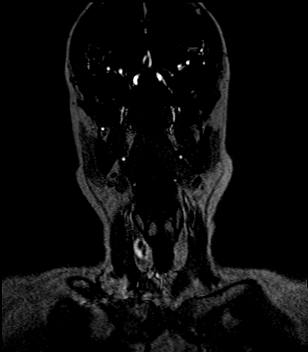
[im 73/88]
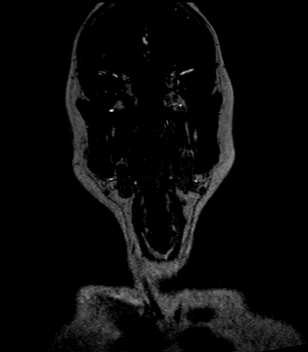
[im 88/88]
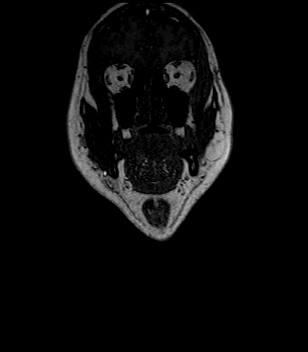

[Series 16: fl3d_ce_cor_+c_ttc=2.0s_moco-adv_sub · coronal · B · 1.0mm · 0.94mm/px · 7 of 84 slices shown (1 of 2)]
[im 1/84]
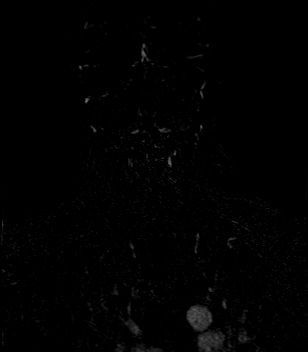
[im 14/84]
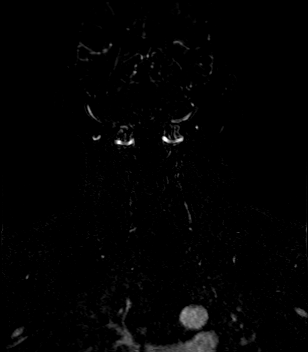
[im 28/84]
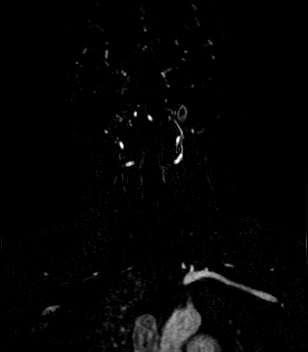
[im 42/84]
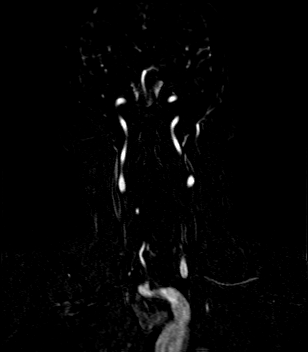
[im 56/84]
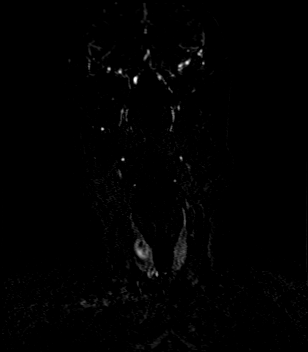
[im 70/84]
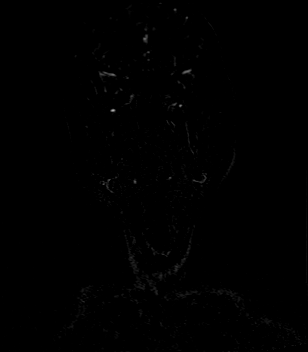
[im 84/84]
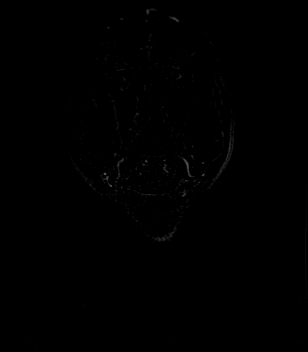

[Series 19: fl3d_ce_cor_+c_ttc=2.0s_moco-adv · coronal · B · 1.0mm · 0.94mm/px · 7 of 88 slices shown (2 of 2)]
[im 1/88]
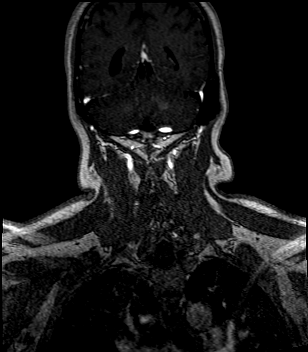
[im 15/88]
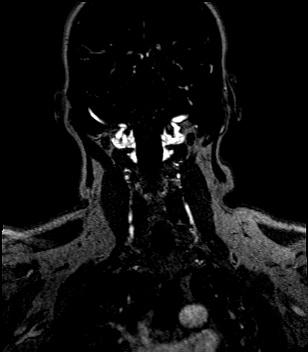
[im 30/88]
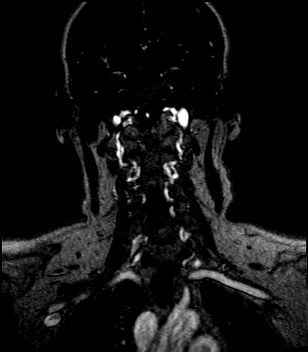
[im 44/88]
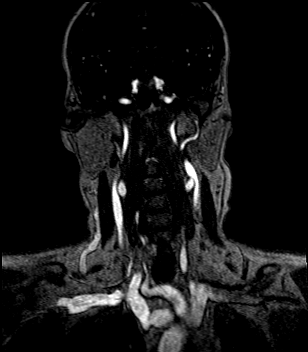
[im 59/88]
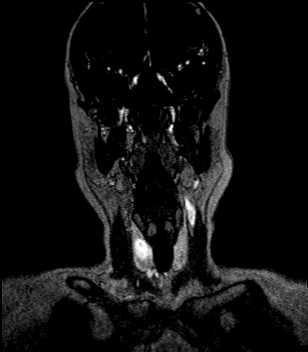
[im 73/88]
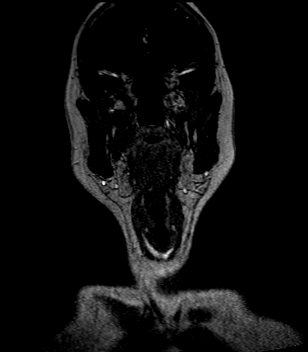
[im 88/88]
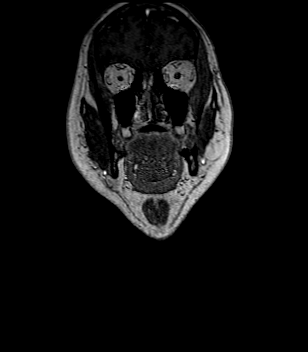

[Series 20: fl3d_ce_cor_+c_ttc=2.0s_moco-adv_sub · coronal · B · 1.0mm · 0.94mm/px · 7 of 85 slices shown (2 of 2)]
[im 1/85]
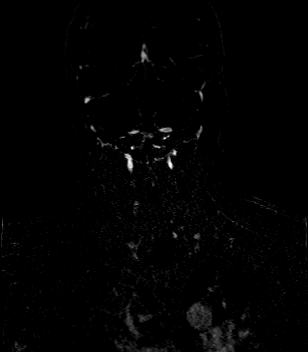
[im 15/85]
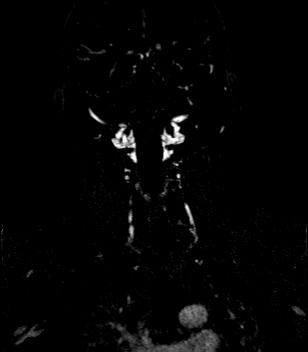
[im 29/85]
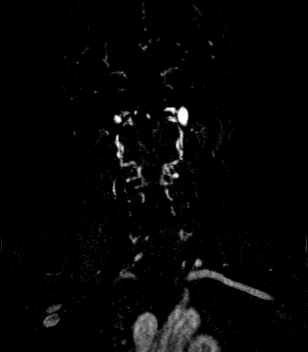
[im 43/85]
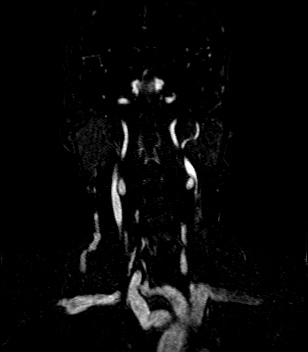
[im 57/85]
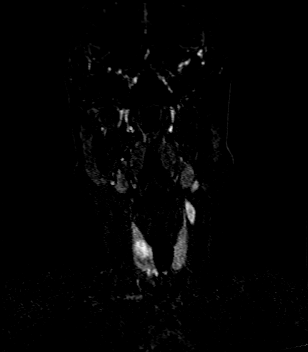
[im 71/85]
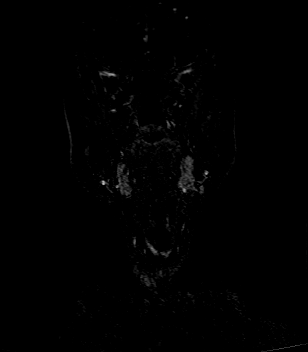
[im 85/85]
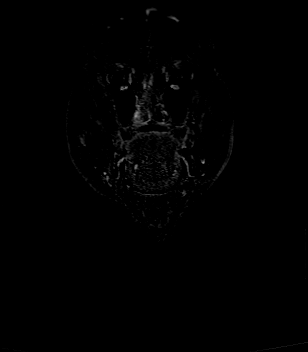

[Series 23: t1_vibe_(person_name)_axial+c_w · axial · B · 2.0mm · 0.89mm/px · z∈[-124,+50]mm · 7 of 88 slices shown]
[im 1/88]
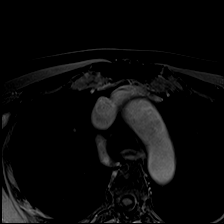
[im 15/88]
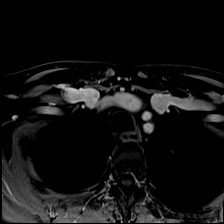
[im 30/88]
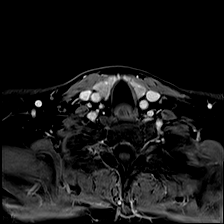
[im 44/88]
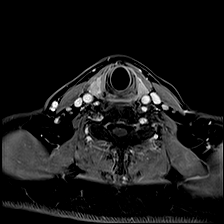
[im 59/88]
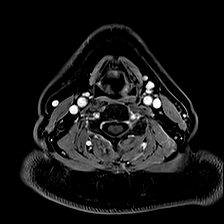
[im 73/88]
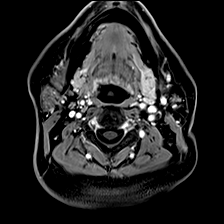
[im 88/88]
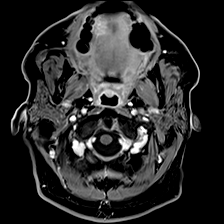

[48 of 48 positions shown; findings below may reference images not displayed]

FINDINGS: Aortic arch: The origins of the brachiocephalic, left common carotid and the left subclavian arteries are patent.
Vertebral arteries: The right and left vertebral arteries are unremarkable.
Right common carotid arteriogram: The right common carotid, right internal and right external carotid arteries are patent.
Left common carotid arteriogram: The left common carotid, left internal and left external carotid arteries are also patent.
Soft tissue neck: 1.8 cm right thyroid gland enhancing nodule.
2-D and 3-D reconstructed images confirm the above findings.
IMPRESSION: 1.  No hemodynamically significant stenosis identified in either carotid arterial system.
2.  1.8 cm right thyroid gland enhancing nodule. Recommend thyroid gland ultrasound evaluation.
